# Patient Record
Sex: Female | Born: 1955 | Race: White | Hispanic: No | Smoking: Never smoker
Health system: Southern US, Community
[De-identification: ages and names within clinical notes are randomized; demographics above are authoritative.]

## PROBLEM LIST (undated history)

## (undated) DIAGNOSIS — I803 Phlebitis and thrombophlebitis of lower extremities, unspecified: Secondary | ICD-10-CM

## (undated) DIAGNOSIS — S300XXA Contusion of lower back and pelvis, initial encounter: Secondary | ICD-10-CM

## (undated) DIAGNOSIS — T148XXA Other injury of unspecified body region, initial encounter: Secondary | ICD-10-CM

## (undated) DIAGNOSIS — O2230 Deep phlebothrombosis in pregnancy, unspecified trimester: Secondary | ICD-10-CM

## (undated) DIAGNOSIS — E785 Hyperlipidemia, unspecified: Secondary | ICD-10-CM

## (undated) DIAGNOSIS — F411 Generalized anxiety disorder: Secondary | ICD-10-CM

## (undated) DIAGNOSIS — I8390 Asymptomatic varicose veins of unspecified lower extremity: Secondary | ICD-10-CM

## (undated) DIAGNOSIS — N951 Menopausal and female climacteric states: Secondary | ICD-10-CM

## (undated) HISTORY — DX: Menopausal and female climacteric states: N95.1

## (undated) HISTORY — DX: Asymptomatic varicose veins of unspecified lower extremity: I83.90

## (undated) HISTORY — DX: Deep phlebothrombosis in pregnancy, unspecified trimester: O22.30

## (undated) HISTORY — DX: Other injury of unspecified body region, initial encounter: T14.8XXA

## (undated) HISTORY — DX: Phlebitis and thrombophlebitis of lower extremities, unspecified: I80.3

## (undated) HISTORY — DX: Contusion of lower back and pelvis, initial encounter: S30.0XXA

## (undated) HISTORY — DX: Hyperlipidemia, unspecified: E78.5

## (undated) HISTORY — DX: Generalized anxiety disorder: F41.1

---

## 1988-01-14 DIAGNOSIS — I803 Phlebitis and thrombophlebitis of lower extremities, unspecified: Secondary | ICD-10-CM

## 1988-01-14 HISTORY — DX: Phlebitis and thrombophlebitis of lower extremities, unspecified: I80.3

## 1988-12-13 HISTORY — PX: CHOLECYSTECTOMY: SHX55

## 1997-10-05 ENCOUNTER — Ambulatory Visit (HOSPITAL_COMMUNITY): Admission: RE | Admit: 1997-10-05 | Discharge: 1997-10-05 | Payer: Self-pay | Admitting: Obstetrics & Gynecology

## 1997-10-05 ENCOUNTER — Encounter: Payer: Self-pay | Admitting: Obstetrics & Gynecology

## 1997-10-11 ENCOUNTER — Ambulatory Visit (HOSPITAL_COMMUNITY): Admission: RE | Admit: 1997-10-11 | Discharge: 1997-10-11 | Payer: Self-pay | Admitting: Obstetrics & Gynecology

## 1998-04-18 ENCOUNTER — Other Ambulatory Visit: Admission: RE | Admit: 1998-04-18 | Discharge: 1998-04-18 | Payer: Self-pay | Admitting: Obstetrics & Gynecology

## 1998-10-17 ENCOUNTER — Ambulatory Visit (HOSPITAL_COMMUNITY): Admission: RE | Admit: 1998-10-17 | Discharge: 1998-10-17 | Payer: Self-pay | Admitting: Obstetrics & Gynecology

## 1998-10-17 ENCOUNTER — Encounter: Payer: Self-pay | Admitting: Obstetrics & Gynecology

## 1999-06-17 ENCOUNTER — Other Ambulatory Visit: Admission: RE | Admit: 1999-06-17 | Discharge: 1999-06-17 | Payer: Self-pay | Admitting: Obstetrics & Gynecology

## 1999-10-21 ENCOUNTER — Ambulatory Visit (HOSPITAL_COMMUNITY): Admission: RE | Admit: 1999-10-21 | Discharge: 1999-10-21 | Payer: Self-pay | Admitting: Obstetrics & Gynecology

## 1999-10-21 ENCOUNTER — Encounter: Payer: Self-pay | Admitting: Obstetrics & Gynecology

## 2000-09-02 ENCOUNTER — Other Ambulatory Visit: Admission: RE | Admit: 2000-09-02 | Discharge: 2000-09-02 | Payer: Self-pay | Admitting: Obstetrics & Gynecology

## 2000-10-29 ENCOUNTER — Ambulatory Visit (HOSPITAL_COMMUNITY): Admission: RE | Admit: 2000-10-29 | Discharge: 2000-10-29 | Payer: Self-pay | Admitting: Obstetrics & Gynecology

## 2000-10-29 ENCOUNTER — Encounter: Payer: Self-pay | Admitting: Obstetrics & Gynecology

## 2001-10-21 ENCOUNTER — Other Ambulatory Visit: Admission: RE | Admit: 2001-10-21 | Discharge: 2001-10-21 | Payer: Self-pay | Admitting: Obstetrics & Gynecology

## 2001-11-09 ENCOUNTER — Encounter: Payer: Self-pay | Admitting: Obstetrics & Gynecology

## 2001-11-09 ENCOUNTER — Ambulatory Visit (HOSPITAL_COMMUNITY): Admission: RE | Admit: 2001-11-09 | Discharge: 2001-11-09 | Payer: Self-pay | Admitting: Obstetrics & Gynecology

## 2002-11-11 ENCOUNTER — Ambulatory Visit (HOSPITAL_COMMUNITY): Admission: RE | Admit: 2002-11-11 | Discharge: 2002-11-11 | Payer: Self-pay | Admitting: Obstetrics & Gynecology

## 2002-12-05 ENCOUNTER — Other Ambulatory Visit: Admission: RE | Admit: 2002-12-05 | Discharge: 2002-12-05 | Payer: Self-pay | Admitting: Obstetrics & Gynecology

## 2003-12-19 ENCOUNTER — Ambulatory Visit (HOSPITAL_COMMUNITY): Admission: RE | Admit: 2003-12-19 | Discharge: 2003-12-19 | Payer: Self-pay | Admitting: Obstetrics & Gynecology

## 2003-12-19 ENCOUNTER — Other Ambulatory Visit: Admission: RE | Admit: 2003-12-19 | Discharge: 2003-12-19 | Payer: Self-pay | Admitting: Obstetrics and Gynecology

## 2004-01-14 HISTORY — PX: ENDOMETRIAL ABLATION: SHX621

## 2004-02-20 ENCOUNTER — Ambulatory Visit (HOSPITAL_COMMUNITY): Admission: RE | Admit: 2004-02-20 | Discharge: 2004-02-20 | Payer: Self-pay | Admitting: Obstetrics & Gynecology

## 2004-02-20 ENCOUNTER — Encounter (INDEPENDENT_AMBULATORY_CARE_PROVIDER_SITE_OTHER): Payer: Self-pay | Admitting: Specialist

## 2005-01-21 ENCOUNTER — Other Ambulatory Visit: Admission: RE | Admit: 2005-01-21 | Discharge: 2005-01-21 | Payer: Self-pay | Admitting: Obstetrics & Gynecology

## 2005-01-21 ENCOUNTER — Ambulatory Visit (HOSPITAL_COMMUNITY): Admission: RE | Admit: 2005-01-21 | Discharge: 2005-01-21 | Payer: Self-pay | Admitting: Obstetrics & Gynecology

## 2006-02-18 ENCOUNTER — Ambulatory Visit (HOSPITAL_COMMUNITY): Admission: RE | Admit: 2006-02-18 | Discharge: 2006-02-18 | Payer: Self-pay | Admitting: Obstetrics & Gynecology

## 2007-03-01 ENCOUNTER — Ambulatory Visit (HOSPITAL_COMMUNITY): Admission: RE | Admit: 2007-03-01 | Discharge: 2007-03-01 | Payer: Self-pay | Admitting: Obstetrics & Gynecology

## 2008-03-01 ENCOUNTER — Ambulatory Visit (HOSPITAL_COMMUNITY): Admission: RE | Admit: 2008-03-01 | Discharge: 2008-03-01 | Payer: Self-pay | Admitting: Obstetrics & Gynecology

## 2008-03-07 ENCOUNTER — Encounter: Admission: RE | Admit: 2008-03-07 | Discharge: 2008-03-07 | Payer: Self-pay | Admitting: Obstetrics & Gynecology

## 2008-09-14 HISTORY — PX: COLONOSCOPY: SHX174

## 2009-03-02 ENCOUNTER — Encounter: Admission: RE | Admit: 2009-03-02 | Discharge: 2009-03-02 | Payer: Self-pay | Admitting: Obstetrics & Gynecology

## 2010-02-02 ENCOUNTER — Other Ambulatory Visit: Payer: Self-pay | Admitting: Obstetrics & Gynecology

## 2010-02-02 DIAGNOSIS — Z1239 Encounter for other screening for malignant neoplasm of breast: Secondary | ICD-10-CM

## 2010-02-13 LAB — HM DEXA SCAN: HM Dexa Scan: NORMAL

## 2010-03-04 ENCOUNTER — Ambulatory Visit: Payer: Self-pay

## 2010-03-05 ENCOUNTER — Ambulatory Visit
Admission: RE | Admit: 2010-03-05 | Discharge: 2010-03-05 | Disposition: A | Discharge status: Home / Self Care | Payer: BC Managed Care – PPO | Source: Ambulatory Visit | Attending: Obstetrics & Gynecology | Admitting: Obstetrics & Gynecology

## 2010-03-05 DIAGNOSIS — Z1239 Encounter for other screening for malignant neoplasm of breast: Secondary | ICD-10-CM

## 2010-03-12 ENCOUNTER — Ambulatory Visit: Payer: Self-pay | Admitting: Internal Medicine

## 2010-05-31 NOTE — Op Note (Signed)
Judith Oconnor, Judith Oconnor                  ACCOUNT NO.:  [id number]   MEDICAL RECORD NO.:  [id number]          PATIENT TYPE:  AMB   LOCATION:  SDC                           FACILITY:  [facility]   PHYSICIAN:  Freddy Finner, M.D.   DATE OF BIRTH:  1955/07/08   DATE OF PROCEDURE:  02/20/2004  DATE OF DISCHARGE:                                 OPERATIVE REPORT   PREOPERATIVE DIAGNOSIS:  Menorrhagia, no identifiable uterine or endometrial  pathology.   POSTOPERATIVE DIAGNOSIS:  Menorrhagia, no identifiable uterine or  endometrial pathology.   OPERATIVE PROCEDURE:  Hysteroscopy, dilatation and curettage, and NovaSure  endometrial ablation.   SURGEON:  Freddy Finner, M.D.   ANESTHESIA:  Intravenous sedation and paracervical block.   ESTIMATED INTRAOPERATIVE BLOOD LOSS:  Less than 10 mL.   LACTATED RINGER'S DEFICIT:  30 mL.   INTRAOPERATIVE COMPLICATIONS:  None.   PARAMETERS WITH NOVASURE:  Uterine sounding length was 7.5, cervical length  2 cm, cavity width 4.4 cm.  Power utilized:  133 watts.  Time of ablation  procedure:  84 seconds.   The patient is a 55 year old with very heavy menses and large clots  compromising her daily activities.  Sonohysterogram has identified no  obvious uterine or endometrial pathology.  She is now admitted for  hysteroscopy and NovaSure.   She was admitted on the morning of surgery, brought to the operating room,  there placed under adequate intravenous sedation, placed in the dorsal  lithotomy position using the [facility] stirrup system.  Betadine prep was  carried out in the usual fashion.  Sterile drapes were applied.  Uterus and  cervix sounded to 7.5 cm.  Cervical length was 2 cm.  Inspection with the  12.5 degree [facility] hysteroscope was accomplished using lactated Ringer's  solution as the distending medium.  This was accomplished after the cervix  was dilated to 23 Hegar dilators.  No obvious endometrial abnormality was  noted.  Gentle thorough curettage and  expression with the Randall stone  forceps was performed to sample the endometrial tissue.  The NovaSure device  was then applied in the standard fashion without difficulty.  The parameters  were noted above.  After appropriate testing of cavity, no evidence of  perforation, the procedure was accomplished.  Reinspection with the  hysteroscope revealed adequate coagulation of the entire endometrial surface  that could be visualized.  The instruments were removed.  The patient was  awakened and taken  to the recovery room in good condition.  She will be discharged in the  immediate postop period for follow-up in the office in about seven to 10  days.  She is given Darvocet as needed for postoperative pain.  She is given  Toradol 30 mg IV in recovery.      WRN/MEDQ  D:  02/20/2004  T:  02/20/2004  Job:  [id number]

## 2010-08-14 ENCOUNTER — Emergency Department (HOSPITAL_COMMUNITY)
Admission: EM | Admit: 2010-08-14 | Discharge: 2010-08-14 | Disposition: A | Discharge status: Home / Self Care | Payer: BC Managed Care – PPO | Attending: Emergency Medicine | Admitting: Emergency Medicine

## 2010-08-14 DIAGNOSIS — M79609 Pain in unspecified limb: Secondary | ICD-10-CM | POA: Insufficient documentation

## 2010-08-14 DIAGNOSIS — Z86718 Personal history of other venous thrombosis and embolism: Secondary | ICD-10-CM | POA: Insufficient documentation

## 2010-08-14 DIAGNOSIS — R609 Edema, unspecified: Secondary | ICD-10-CM | POA: Insufficient documentation

## 2010-08-14 LAB — DIFFERENTIAL
Basophils Absolute: 0 10*3/uL (ref 0.0–0.1)
Lymphocytes Relative: 29 % (ref 12–46)
Lymphs Abs: 2.1 10*3/uL (ref 0.7–4.0)
Neutro Abs: 4.3 10*3/uL (ref 1.7–7.7)
Neutrophils Relative %: 60 % (ref 43–77)

## 2010-08-14 LAB — COMPREHENSIVE METABOLIC PANEL
ALT: 30 U/L (ref 0–35)
AST: 23 U/L (ref 0–37)
Albumin: 4.3 g/dL (ref 3.5–5.2)
Alkaline Phosphatase: 75 U/L (ref 39–117)
BUN: 14 mg/dL (ref 6–23)
Calcium: 9.9 mg/dL (ref 8.4–10.5)
Chloride: 102 mEq/L (ref 96–112)
Potassium: 3.8 mEq/L (ref 3.5–5.1)
Total Protein: 7.4 g/dL (ref 6.0–8.3)

## 2010-08-14 LAB — CBC
HCT: 37.2 % (ref 36.0–46.0)
Hemoglobin: 12.4 g/dL (ref 12.0–15.0)
MCH: 29.7 pg (ref 26.0–34.0)
MCHC: 33.3 g/dL (ref 30.0–36.0)
MCV: 89.2 fL (ref 78.0–100.0)
RBC: 4.17 MIL/uL (ref 3.87–5.11)

## 2010-08-14 LAB — PROTIME-INR: Prothrombin Time: 11.9 seconds (ref 11.6–15.2)

## 2010-08-15 ENCOUNTER — Ambulatory Visit (HOSPITAL_COMMUNITY)
Admission: RE | Admit: 2010-08-15 | Discharge: 2010-08-15 | Disposition: A | Discharge status: Home / Self Care | Payer: BC Managed Care – PPO | Source: Ambulatory Visit | Attending: Emergency Medicine | Admitting: Emergency Medicine

## 2010-08-15 DIAGNOSIS — M79609 Pain in unspecified limb: Secondary | ICD-10-CM

## 2010-08-16 ENCOUNTER — Ambulatory Visit (HOSPITAL_COMMUNITY)
Admission: RE | Admit: 2010-08-16 | Discharge: 2010-08-16 | Disposition: A | Discharge status: Home / Self Care | Payer: BC Managed Care – PPO | Source: Ambulatory Visit | Attending: Internal Medicine | Admitting: Internal Medicine

## 2010-10-06 DIAGNOSIS — T148XXA Other injury of unspecified body region, initial encounter: Secondary | ICD-10-CM

## 2010-10-06 HISTORY — DX: Other injury of unspecified body region, initial encounter: T14.8XXA

## 2010-10-30 ENCOUNTER — Encounter (INDEPENDENT_AMBULATORY_CARE_PROVIDER_SITE_OTHER): Payer: Self-pay | Admitting: General Surgery

## 2010-10-30 ENCOUNTER — Ambulatory Visit (INDEPENDENT_AMBULATORY_CARE_PROVIDER_SITE_OTHER): Payer: BC Managed Care – PPO | Admitting: General Surgery

## 2010-10-30 VITALS — BP 112/78 | HR 80 | Temp 98.0°F | Resp 20 | Ht 67.0 in | Wt 161.1 lb

## 2010-10-30 DIAGNOSIS — S7002XA Contusion of left hip, initial encounter: Secondary | ICD-10-CM

## 2010-10-30 DIAGNOSIS — S7000XA Contusion of unspecified hip, initial encounter: Secondary | ICD-10-CM

## 2010-10-30 NOTE — Progress Notes (Signed)
Chief Complaint  Patient presents with  . Other    new pt fell and has a bruise/ hematoma on left buttock     HPI Judith Oconnor is a 55 y.o. female.  this patient presents today for evaluation of swelling in her left buttock region after falling from a [place].  She fell approximately 4 weeks ago onto her buttocks and since then has had persistent swelling in the region. She also had some bruising but the bruising is improving. She has minimal tenderness and denies any neurologic symptoms in her extremities. She denied any other injuries. She is not on any blood thinners other than a baby aspirin daily.Although her bruising is improving and she is minimally symptomatic she is concerned because she still has swelling in the region. HPI  Past Medical History  Diagnosis Date  . Hematoma 10/06/10    buttock     Past Surgical History  Procedure Date  . Cholecystectomy December 1990  . Endometrial ablation 2006    History reviewed. No pertinent family history.  Social History History  Substance Use Topics  . Smoking status: Never Smoker   . Smokeless tobacco: Never Used  . Alcohol Use: 0.0 oz/week    6-8 Glasses of wine per week    No Known Allergies  Current Outpatient Prescriptions  Medication Sig Dispense Refill  . escitalopram (LEXAPRO) 10 MG tablet       . Estradiol (ELESTRIN TD) Place onto the skin.          Review of Systems Review of Systems  All other systems reviewed and are negative.    Blood pressure 112/78, pulse 80, temperature 98 F (36.7 C), resp. rate 20, height 5\' 7"  (1.702 m), weight 161 lb 2 oz (73.086 kg).  Physical Exam Physical Exam  Constitutional: She is oriented to person, place, and time. She appears well-developed and well-nourished. No distress.  HENT:  Head: Normocephalic and atraumatic.  Mouth/Throat: No oropharyngeal exudate.  Eyes: Conjunctivae and EOM are normal. Pupils are equal, round, and reactive to light. Right eye exhibits no  discharge. Left eye exhibits no discharge. No scleral icterus.  Neck: Normal range of motion. Neck supple. No tracheal deviation present.  Cardiovascular: Normal rate, regular rhythm and normal heart sounds.   Pulmonary/Chest: Effort normal and breath sounds normal. No stridor. No respiratory distress. She has no wheezes.  Abdominal: Soft. Bowel sounds are normal. She exhibits no distension and no mass. There is no tenderness. There is no rebound and no guarding.  Musculoskeletal: Normal range of motion. She exhibits no edema and no tenderness.  Neurological: She is alert and oriented to person, place, and time.  Skin: Skin is warm and dry. No rash noted. She is not diaphoretic. No erythema. No pallor.       She has a firm mass in her upper left buttock region and some bruising on her lateral hip and inferior buttock region in the late stages of healing.  Bedside ultrasound was performed with high-frequency probe. This demonstrated a 2-1/2-3 cm deep fluid collection extending from her lateral hip to her midline approximately 20 cm wide it up to 10 cm from cranial caudad. This appears to be a simple fluid collection and is consistent with traumatic hematoma.  Psychiatric: She has a normal mood and affect. Her behavior is normal. Judgment and thought content normal.    Data Reviewed   Assessment    Traumatic hematoma of the left buttock region, no evidence of infection or active  bleeding.      Plan    This does appear to be a hematoma by history and physical exam and by bedside ultrasound. I think that this should resolve with time and I do not see any reason to drain this surgically. I explained that catheter drainage would likely be ineffective at a completely draining the cavity and may potentially increase the risk of infection. I explained that this should resolve slowly with time and she can continue her aspirin use since this appears to be stable in size. I recommended that she return  to our clinic her to the emergency room if any redness or fevers or increasing tenderness or pain at this may indicate infection. But again, at this time I would not recommend any surgical intervention for this wound.       Lodema Pilot DAVID 10/30/2010, 10:48 AM

## 2011-02-11 ENCOUNTER — Other Ambulatory Visit: Payer: Self-pay | Admitting: Obstetrics & Gynecology

## 2011-02-11 DIAGNOSIS — Z1231 Encounter for screening mammogram for malignant neoplasm of breast: Secondary | ICD-10-CM

## 2011-03-10 ENCOUNTER — Ambulatory Visit
Admission: RE | Admit: 2011-03-10 | Discharge: 2011-03-10 | Disposition: A | Discharge status: Home / Self Care | Payer: BC Managed Care – PPO | Source: Ambulatory Visit | Attending: Obstetrics & Gynecology | Admitting: Obstetrics & Gynecology

## 2011-03-10 DIAGNOSIS — Z1231 Encounter for screening mammogram for malignant neoplasm of breast: Secondary | ICD-10-CM

## 2012-02-14 LAB — HM PAP SMEAR: HM Pap smear: NORMAL

## 2012-03-15 ENCOUNTER — Other Ambulatory Visit: Payer: Self-pay

## 2012-03-15 DIAGNOSIS — Z1231 Encounter for screening mammogram for malignant neoplasm of breast: Secondary | ICD-10-CM

## 2012-04-07 ENCOUNTER — Ambulatory Visit
Admission: RE | Admit: 2012-04-07 | Discharge: 2012-04-07 | Disposition: A | Discharge status: Home / Self Care | Payer: BC Managed Care – PPO | Source: Ambulatory Visit

## 2012-04-07 DIAGNOSIS — Z1231 Encounter for screening mammogram for malignant neoplasm of breast: Secondary | ICD-10-CM

## 2012-11-29 ENCOUNTER — Encounter: Payer: Self-pay | Admitting: Internal Medicine

## 2012-11-30 ENCOUNTER — Encounter (INDEPENDENT_AMBULATORY_CARE_PROVIDER_SITE_OTHER): Payer: BC Managed Care – PPO | Admitting: *Deleted

## 2012-11-30 ENCOUNTER — Encounter: Payer: Self-pay | Admitting: Internal Medicine

## 2012-11-30 DIAGNOSIS — Z23 Encounter for immunization: Secondary | ICD-10-CM

## 2012-12-22 ENCOUNTER — Ambulatory Visit (INDEPENDENT_AMBULATORY_CARE_PROVIDER_SITE_OTHER): Payer: BC Managed Care – PPO | Admitting: Internal Medicine

## 2012-12-22 ENCOUNTER — Encounter: Payer: Self-pay | Admitting: Internal Medicine

## 2012-12-22 VITALS — BP 138/86 | HR 66 | Temp 97.3°F | Resp 12 | Ht 66.5 in | Wt 158.0 lb

## 2012-12-22 DIAGNOSIS — I803 Phlebitis and thrombophlebitis of lower extremities, unspecified: Secondary | ICD-10-CM

## 2012-12-22 DIAGNOSIS — I8390 Asymptomatic varicose veins of unspecified lower extremity: Secondary | ICD-10-CM

## 2012-12-22 DIAGNOSIS — N951 Menopausal and female climacteric states: Secondary | ICD-10-CM

## 2012-12-22 DIAGNOSIS — F411 Generalized anxiety disorder: Secondary | ICD-10-CM | POA: Insufficient documentation

## 2012-12-22 DIAGNOSIS — S300XXA Contusion of lower back and pelvis, initial encounter: Secondary | ICD-10-CM | POA: Insufficient documentation

## 2012-12-22 DIAGNOSIS — S300XXD Contusion of lower back and pelvis, subsequent encounter: Secondary | ICD-10-CM

## 2012-12-22 DIAGNOSIS — Z5189 Encounter for other specified aftercare: Secondary | ICD-10-CM

## 2012-12-22 NOTE — Patient Instructions (Signed)
Continue current medications. 

## 2012-12-22 NOTE — Progress Notes (Signed)
Subjective:    Patient ID: Judith Oconnor, female    DOB: [birth year], 56 y.o.   MRN: [id number]  Chief Complaint  Patient presents with  . Annual Exam    Yearly annual exam   . Leg Problem    Varicose vein concerns     HPI Varicose vein of leg ; had phlebitis of the left leg in 1990. Leg is now uncomfortable and has varicosities evident.  Contusion of buttock, subsequent encounter: hematoma and contusion of buttock in 2012 when she fell from a horse.  Anxiety state, unspecified: improved. She is off Lexapro.  Symptomatic menopausal or female climacteric states: she is on estrogens recommended by Dr. Jennette Kettle  Thrombophlebitis leg: in 1990 and now having residual problems as a result of vein damage then    Current Outpatient Prescriptions on File Prior to Visit  Medication Sig Dispense Refill  . Estradiol (ELESTRIN TD) Place onto the skin.         No current facility-administered medications on file prior to visit.   No Known Allergies  Immunization History  Administered Date(s) Administered  . Influenza,inj,Quad PF,36+ Mos 11/30/2012  . Influenza-Unspecified 11/19/2006, 10/11/2007, 11/25/2011  . Tdap 11/25/2011     Family History  Problem Relation Age of Onset  . Coronary artery disease Father   . Macular degeneration Mother    Family Status  Relation Status Death Age  . Mother Alive   . Father Alive   . Sister Alive   . Sister Alive   . Daughter Alive   . Son Alive   . Daughter Alive    History   Social History  . Marital Status: Widowed    Spouse Name: N/A    Number of Children: N/A  . Years of Education: N/A   Social History Main Topics  . Smoking status: Never Smoker   . Smokeless tobacco: Never Used  . Alcohol Use: 0.0 oz/week    6-8 Glasses of wine per week  . Drug Use: No  . Sexual Activity:    Other Topics Concern  . None   Social History Narrative   Dr.Neal-GYN   Dr.Martin-General Surgeon   Dr.Medoff-GI     PROCEDURES Sept 2010  Colonoscopy [facility]): normal Feb 2012 DEXA: normal Feb 2013 Pap Jennette Kettle): normal  Review of Systems  Constitutional: Negative.   HENT: Negative.   Eyes: Negative.   Respiratory: Negative.   Cardiovascular: Negative for chest pain, palpitations and leg swelling.  Gastrointestinal: Negative.  Negative for nausea, vomiting, abdominal pain, diarrhea and abdominal distention.  Endocrine: Negative.        Using an estrogen gel that is prescribed by Dr. Jennette Kettle. Continues with hot flashes from time to time despite use of estrogen.  Musculoskeletal: Negative.  Negative for arthralgias, back pain, myalgias, neck pain and neck stiffness.  Skin: Negative.  Negative for color change, pallor, rash and wound.  Allergic/Immunologic: Negative.   Neurological: Negative.   Hematological: Negative.   Psychiatric/Behavioral: Negative.        Objective:BP 138/86  Pulse 66  Temp(Src) 97.3 F (36.3 C) (Oral)  Resp 12  Ht 5' 6.5" (1.689 m)  Wt 158 lb (71.668 kg)  BMI 25.12 kg/m2  SpO2 99%    Physical Exam  Constitutional: She is oriented to person, place, and time. She appears well-developed and well-nourished. No distress.  HENT:  Head: Normocephalic and atraumatic.  Right Ear: External ear normal.  Left Ear: External ear normal.  Mouth/Throat: Oropharynx is clear and moist.  Eyes: Conjunctivae are normal. Pupils are equal, round, and reactive to light.  Neck: No JVD present. No tracheal deviation present. No thyromegaly present.  Cardiovascular: Normal rate, regular rhythm, normal heart sounds and intact distal pulses.  Exam reveals no gallop and no friction rub.   No murmur heard. Varicosities of the left leg. History of phlebitis of the left leg in 1990.  Pulmonary/Chest: No respiratory distress. She has no wheezes. She has no rales. She exhibits no tenderness.  Abdominal: She exhibits no distension and no mass. There is no tenderness.  Genitourinary:  Dr. Jennette Kettle  Musculoskeletal: Normal range  of motion. She exhibits no edema and no tenderness.  Lymphadenopathy:    She has no cervical adenopathy.  Neurological: She is alert and oriented to person, place, and time. She has normal reflexes. No cranial nerve deficit. Coordination normal.  Skin: No rash noted. No erythema. No pallor.  Continues to have a firm tissue change in the left buttock as a result of the prior large hematoma in this area.  Psychiatric: She has a normal mood and affect. Her behavior is normal. Judgment and thought content normal.          Assessment & Plan:  Varicose vein of leg - Plan: Ambulatory referral to Vascular Surgery, Lipid panel  Contusion of buttock, subsequent encounter: residual tissue changes in the left buttock  Anxiety state, unspecified: improved  Symptomatic menopausal or female climacteric states - Plan: CMP, TSH

## 2012-12-27 ENCOUNTER — Other Ambulatory Visit: Payer: Self-pay | Admitting: *Deleted

## 2012-12-27 DIAGNOSIS — I83893 Varicose veins of bilateral lower extremities with other complications: Secondary | ICD-10-CM

## 2013-02-25 ENCOUNTER — Encounter: Payer: BC Managed Care – PPO | Admitting: Vascular Surgery

## 2013-02-25 ENCOUNTER — Encounter (HOSPITAL_COMMUNITY): Payer: BC Managed Care – PPO

## 2013-03-03 ENCOUNTER — Other Ambulatory Visit: Payer: Self-pay

## 2013-03-03 DIAGNOSIS — Z1231 Encounter for screening mammogram for malignant neoplasm of breast: Secondary | ICD-10-CM

## 2013-03-25 ENCOUNTER — Encounter (HOSPITAL_COMMUNITY): Payer: BC Managed Care – PPO

## 2013-03-25 ENCOUNTER — Encounter: Payer: Self-pay | Admitting: Vascular Surgery

## 2013-03-25 ENCOUNTER — Encounter: Payer: BC Managed Care – PPO | Admitting: Vascular Surgery

## 2013-03-28 ENCOUNTER — Ambulatory Visit (INDEPENDENT_AMBULATORY_CARE_PROVIDER_SITE_OTHER): Payer: BC Managed Care – PPO | Admitting: Vascular Surgery

## 2013-03-28 ENCOUNTER — Encounter: Payer: Self-pay | Admitting: Vascular Surgery

## 2013-03-28 ENCOUNTER — Ambulatory Visit (HOSPITAL_COMMUNITY)
Admission: RE | Admit: 2013-03-28 | Discharge: 2013-03-28 | Disposition: A | Discharge status: Home / Self Care | Payer: BC Managed Care – PPO | Source: Ambulatory Visit | Attending: Vascular Surgery | Admitting: Vascular Surgery

## 2013-03-28 VITALS — BP 158/77 | HR 71 | Resp 16 | Ht 67.0 in | Wt 158.0 lb

## 2013-03-28 DIAGNOSIS — I83893 Varicose veins of bilateral lower extremities with other complications: Secondary | ICD-10-CM | POA: Insufficient documentation

## 2013-03-28 NOTE — Progress Notes (Signed)
Subjective:     Patient ID: Judith Oconnor, female   DOB: [birth year], 58 y.o.   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 Frederik Pear. Patient states in 1990 she developed a DVT 5 weeks after a long plane flight. She was treated with subcutaneous heparin injections because she was pregnant at the time. She's had no subsequent DVT. She has not been on chronic anticoagulation. She has mild swelling in the left leg as the day progresses. She has developed significant varicosities as well as diffuse spider and reticular veins. She has had venous aching and throbbing in the varicosities near the left ankle as the day progresses. She does not wear elastic compression stockings a regular basis or elevate her legs. She has no history stasis ulcers or bleeding.  Past Medical History  Diagnosis Date  . Hematoma 10/06/10    buttock   . Anxiety state, unspecified   . Varicose vein of leg   . Contusion of buttock   . Symptomatic menopausal or female climacteric states   . Thrombophlebitis leg 1990    History  Substance Use Topics  . Smoking status: Never Smoker   . Smokeless tobacco: Never Used  . Alcohol Use: 0.0 oz/week    6-8 Glasses of wine per week    Family History  Problem Relation Age of Onset  . Coronary artery disease Father   . Macular degeneration Mother     No Known Allergies  Current outpatient prescriptions:Estradiol (ELESTRIN TD), Place onto the skin.  , Disp: , Rfl: ;  Progesterone 100 MG CAPS, Take by mouth. Take x 10 days every 3 months, Disp: , Rfl:   BP 158/77  Pulse 71  Resp 16  Ht 5\' 7"  (1.702 m)  Wt 158 lb (71.668 kg)  BMI 24.74 kg/m2  Body mass index is 24.74 kg/(m^2).          Review of Systems denies chest pain, dyspnea on exertion, PND, orthopnea, mops as, claudication. Does have a history of swelling in the left leg and varicose veins  as noted in the history and physical. All other systems negative and a complete review of systems     Objective:   Physical Exam BP 158/77  Pulse 71  Resp 16  Ht 5\' 7"  (1.702 m)  Wt 158 lb (71.668 kg)  BMI 24.74 kg/m2  Gen.-alert and oriented x3 in no apparent distress HEENT normal for age Lungs no rhonchi or wheezing Cardiovascular regular rhythm no murmurs carotid pulses 3+ palpable no bruits audible Abdomen soft nontender no palpable masses Musculoskeletal free of  major deformities Skin clear -no rashes Neurologic normal Lower extremities 3+ femoral and dorsalis pedis pulses palpable bilaterally with no edema in the right leg Left leg with 1+ edema. Bulging varicosities over the great saphenous system lower third of the leg. Spider and reticular veins in the lower leg particularly the lower third down to the malleolar area. No active ulcers are noted. No hyperpigmentation noted.  Today I ordered a venous duplex exam the left leg which are reviewed and interpreted. There is no DVT. There are some chronic changes in left common femoral vein where the previous DVT was located but it is widely patent. The left great saphenous vein has gross reflux throughout and is a large caliber vein. 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

## 2013-04-25 ENCOUNTER — Ambulatory Visit
Admission: RE | Admit: 2013-04-25 | Discharge: 2013-04-25 | Disposition: A | Discharge status: Home / Self Care | Payer: BC Managed Care – PPO | Source: Ambulatory Visit

## 2013-04-25 DIAGNOSIS — Z1231 Encounter for screening mammogram for malignant neoplasm of breast: Secondary | ICD-10-CM

## 2013-06-27 ENCOUNTER — Encounter: Payer: Self-pay | Admitting: Vascular Surgery

## 2013-06-28 ENCOUNTER — Ambulatory Visit (INDEPENDENT_AMBULATORY_CARE_PROVIDER_SITE_OTHER): Payer: BC Managed Care – PPO | Admitting: Vascular Surgery

## 2013-06-28 ENCOUNTER — Ambulatory Visit: Payer: BC Managed Care – PPO | Admitting: Vascular Surgery

## 2013-06-28 ENCOUNTER — Encounter: Payer: Self-pay | Admitting: Vascular Surgery

## 2013-06-28 VITALS — BP 119/79 | HR 69 | Resp 16 | Ht 67.0 in | Wt 156.0 lb

## 2013-06-28 DIAGNOSIS — I83893 Varicose veins of bilateral lower extremities with other complications: Secondary | ICD-10-CM

## 2013-06-28 NOTE — Progress Notes (Signed)
Subjective:     Patient ID: Judith Oconnor, female   DOB: [birth year], 58 y.o.   MRN: [id number]  HPI this 58 year old female returns for continuing followup regarding her painful varicosities in the left lower extremity. She has tried long-leg elastic compression stockings 20-30 mm gradient as well as elevation ibuprofen continues to pain consisting of burning and itching discomfort as well as edema in the left ankle as the day progresses. Her symptoms continue to affect her daily living and are not responding to conservative measures.  Past Medical History  Diagnosis Date  . Hematoma 10/06/10    buttock   . Anxiety state, unspecified   . Varicose vein of leg   . Contusion of buttock   . Symptomatic menopausal or female climacteric states   . Thrombophlebitis leg 1990    History  Substance Use Topics  . Smoking status: Never Smoker   . Smokeless tobacco: Never Used  . Alcohol Use: 0.0 oz/week    6-8 Glasses of wine per week    Family History  Problem Relation Age of Onset  . Coronary artery disease Father   . Macular degeneration Mother     No Known Allergies  Current outpatient prescriptions:Estradiol (ELESTRIN TD), Place onto the skin.  , Disp: , Rfl: ;  Progesterone 100 MG CAPS, Take by mouth. Take x 10 days every 3 months, Disp: , Rfl:   There were no vitals taken for this visit.  There is no weight on file to calculate BMI.          Review of Systems denies chest pain, dyspnea on exertion, PND, orthopnea, hemoptysis.     Objective:   Physical Exam There were no vitals taken for this visit. General well-developed well-nourished female in no apparent stress were noted x3 Left lower extremity with bulging varicosities lower third of leg around medial malleolus with reticular veins. One plus distal edema. 3+ dorsalis pedis pulse palpable. Also prominent veins in left patellar region.       Assessment:     Gross reflux left great saphenous system supplying  bulging painful varicosities particularly in distal leg around the ankle area. Causing pain which is not responding to conservative measures including long-leg elastic compression stockings 20-30 mm gradient, elevation, and ibuprofen History DVT left common femoral vein 25 years ago-patent at this time via ultrasound     Plan:     Patient needs laser ablation left great saphenous vein with 10-20 stab phlebectomy painful varicosities We'll proceed with precertification to perform this in the near future to relieve her symptoms

## 2013-07-27 ENCOUNTER — Other Ambulatory Visit: Payer: Self-pay | Admitting: *Deleted

## 2013-07-27 DIAGNOSIS — I83893 Varicose veins of bilateral lower extremities with other complications: Secondary | ICD-10-CM

## 2013-10-10 ENCOUNTER — Other Ambulatory Visit: Payer: BC Managed Care – PPO | Admitting: Vascular Surgery

## 2013-10-11 ENCOUNTER — Encounter: Payer: Self-pay | Admitting: Vascular Surgery

## 2013-10-12 ENCOUNTER — Encounter: Payer: Self-pay | Admitting: Vascular Surgery

## 2013-10-12 ENCOUNTER — Ambulatory Visit (INDEPENDENT_AMBULATORY_CARE_PROVIDER_SITE_OTHER): Payer: BC Managed Care – PPO | Admitting: Vascular Surgery

## 2013-10-12 VITALS — BP 135/77 | HR 69 | Resp 16 | Ht 67.0 in | Wt 160.0 lb

## 2013-10-12 DIAGNOSIS — I83893 Varicose veins of bilateral lower extremities with other complications: Secondary | ICD-10-CM

## 2013-10-12 HISTORY — PX: VARICOSE VEIN SURGERY: SHX832

## 2013-10-12 NOTE — Progress Notes (Signed)
Subjective:     Patien t ID: Judith Oconnor, female   DOB: September 04, 1955, 58 y.o.   MRN: [id number]  HPI this 58 year old female had laser blush in the left great saphenous vein from the proximal calf to near the saphenofemoral junction +10-20 stab phlebectomy of painful varicosities performed under local tumescent anesthesia. A total of 2275 J of energy was utilized. She tolerated the procedure well.  Review of Systems     Objective:   Physical Exam BP 135/77  Pulse 69  Resp 16  Ht 5\' 7"  (1.702 m)  Wt 160 lb (72.576 kg)  BMI 25.05 kg/m2       Assessment:     Well-tolerated laser ablation left great saphenous vein +10-20 stab phlebectomy of painful varicosities performed under local tumescent anesthesia    Plan:     Return in one week for venous duplex exam to confirm closure left great saphenous vein and this will complete treatment regimen

## 2013-10-12 NOTE — Progress Notes (Signed)
   Laser Ablation Procedure      Date: 10/12/2013    Judith Oconnor DOB:09/28/1955  Consent signed: Yes  Surgeon:J.D. Hart RochesterLawson  Procedure: Laser Ablation: left Greater Saphenous Vein  BP 135/77  Pulse 69  Resp 16  Ht 5\' 7"  (1.702 m)  Wt 160 lb (72.576 kg)  BMI 25.05 kg/m2  Start time: 9:15   End time: 10:20  Tumescent Anesthesia: 400 cc 0.9% NaCl with 50 cc Lidocaine HCL with 1% Epi and 15 cc 8.4% NaHCO3  Local Anesthesia: 7 cc Lidocaine HCL and NaHCO3 (ratio 2:1)  Pulsed mode: 15 watts, 500 ms delay, 1.0 duration  Total energy: 2275, Total pulses: 152, Total time: 2:31     Stab Phlebectomy: >20 Sites: Calf  Patient tolerated procedure well: Yes  Notes:   Description of Procedure:  After marking the course of the secondary varicosities, the patient was placed on the operating table in the supine position, and the left leg was prepped and draped in sterile fashion.   Local anesthetic was administered and under ultrasound guidance the saphenous vein was accessed with a micro needle and guide wire; then the mirco puncture sheath was place.  A guide wire was inserted saphenofemoral junction , followed by a 5 french sheath.  The position of the sheath and then the laser fiber below the junction was confirmed using the ultrasound.  Tumescent anesthesia was administered along the course of the saphenous vein using ultrasound guidance. The patient was placed in Trendelenburg position and protective laser glasses were placed on patient and staff, and the laser was fired at 15 watt pulsed mode advancing 1-2 mm per sec for a total of 2275 joules.   For stab phlebectomies, local anesthetic was administered at the previously marked varicosities, and tumescent anesthesia was administered around the vessels.  Greater than 20 stab wounds were made using the tip of an 11 blade. And using the vein hook, the phlebectomies were performed using a hemostat to avulse the varicosities.  Adequate  hemostasis was achieved.     Steri strips were applied to the stab wounds and ABD pads and thigh high compression stockings were applied.  Ace wrap bandages were applied over the phlebectomy sites and at the top of the saphenofemoral junction. Blood loss was less than 15 cc.  The patient ambulated out of the operating room having tolerated the procedure well.

## 2013-10-13 ENCOUNTER — Encounter: Payer: Self-pay | Admitting: Vascular Surgery

## 2013-10-17 ENCOUNTER — Encounter: Payer: Self-pay | Admitting: Vascular Surgery

## 2013-10-18 ENCOUNTER — Ambulatory Visit (HOSPITAL_COMMUNITY)
Admission: RE | Admit: 2013-10-18 | Discharge: 2013-10-18 | Disposition: A | Discharge status: Home / Self Care | Payer: BC Managed Care – PPO | Source: Ambulatory Visit | Attending: Vascular Surgery | Admitting: Vascular Surgery

## 2013-10-18 ENCOUNTER — Encounter: Payer: Self-pay | Admitting: Vascular Surgery

## 2013-10-18 ENCOUNTER — Ambulatory Visit (INDEPENDENT_AMBULATORY_CARE_PROVIDER_SITE_OTHER): Payer: Self-pay | Admitting: Vascular Surgery

## 2013-10-18 VITALS — BP 136/74 | HR 73 | Resp 16 | Ht 67.0 in | Wt 160.0 lb

## 2013-10-18 DIAGNOSIS — I83899 Varicose veins of unspecified lower extremities with other complications: Secondary | ICD-10-CM | POA: Insufficient documentation

## 2013-10-18 DIAGNOSIS — I8393 Asymptomatic varicose veins of bilateral lower extremities: Secondary | ICD-10-CM

## 2013-10-18 DIAGNOSIS — I83892 Varicose veins of left lower extremities with other complications: Secondary | ICD-10-CM

## 2013-10-18 NOTE — Progress Notes (Signed)
Subjective:     Patient ID: Judith Oconnor, female   DOB: 1955-07-28, 58 y.o.   MRN: [id number]  HPI this 58 year old female returns 1 week post laser ablation left great saphenous vein with multiple stab phlebectomy of painful varicosities. She has had some mild-to-moderate discomfort in the mid to proximal thigh along the ablation site. There's been no pain associated with the stab phlebectomy sites. She's had no distal edema. She denies any chest pain dyspnea on exertion PND orthopnea or hemoptysis.   Review of Systems     Objective:   Physical Exam BP 136/74  Pulse 73  Resp 16  Ht 5\' 7"  (1.702 m)  Wt 160 lb (72.576 kg)  BMI 25.05 kg/m2  General well-developed well-nourished female no apparent distress alert and oriented x3 Left leg with mild discomfort along course of great saphenous vein from saphenofemoral junction to distal thigh. No distal edema. 3 posterior cells pedis pulse palpable. Stab phlebectomy sites healing nicely.  Today I ordered a venous duplex exam of the left leg which I reviewed and interpreted. There is no new DVT. The left great saphenous vein is totally closed up to the saphenofemoral junction with no extension into the deep system. 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     Assessment:     Successful laser ablation left great saphenous vein with multiple stab phlebectomy of painful varicosities    Plan:     Return to see us on when necessary basis

## 2013-12-23 ENCOUNTER — Other Ambulatory Visit: Payer: Self-pay

## 2013-12-23 ENCOUNTER — Other Ambulatory Visit: Payer: BC Managed Care – PPO

## 2013-12-23 DIAGNOSIS — Z0000 Encounter for general adult medical examination without abnormal findings: Secondary | ICD-10-CM

## 2013-12-24 LAB — TSH: TSH: 1.43 u[IU]/mL (ref 0.450–4.500)

## 2013-12-24 LAB — COMPREHENSIVE METABOLIC PANEL
A/G RATIO: 1.8 (ref 1.1–2.5)
ALT: 14 IU/L (ref 0–32)
AST: 14 IU/L (ref 0–40)
Albumin: 4.4 g/dL (ref 3.5–5.5)
Alkaline Phosphatase: 58 IU/L (ref 39–117)
BILIRUBIN TOTAL: 0.4 mg/dL (ref 0.0–1.2)
BUN/Creatinine Ratio: 19 (ref 9–23)
BUN: 13 mg/dL (ref 6–24)
CO2: 23 mmol/L (ref 18–29)
Calcium: 9.3 mg/dL (ref 8.7–10.2)
Chloride: 105 mmol/L (ref 97–108)
Creatinine, Ser: 0.69 mg/dL (ref 0.57–1.00)
GFR, EST AFRICAN AMERICAN: 112 mL/min/{1.73_m2} (ref >59)
GFR, EST NON AFRICAN AMERICAN: 97 mL/min/{1.73_m2} (ref >59)
Globulin, Total: 2.4 g/dL (ref 1.5–4.5)
Glucose: 102 mg/dL — ABNORMAL HIGH (ref 65–99)
Potassium: 4.5 mmol/L (ref 3.5–5.2)
Sodium: 142 mmol/L (ref 134–144)
TOTAL PROTEIN: 6.8 g/dL (ref 6.0–8.5)

## 2013-12-24 LAB — LIPID PANEL
CHOL/HDL RATIO: 4 ratio (ref 0.0–4.4)
Cholesterol, Total: 218 mg/dL — ABNORMAL HIGH (ref 100–199)
HDL: 55 mg/dL (ref >39)
LDL CALC: 128 mg/dL — AB (ref 0–99)
Triglycerides: 174 mg/dL — ABNORMAL HIGH (ref 0–149)
VLDL CHOLESTEROL CAL: 35 mg/dL (ref 5–40)

## 2013-12-27 ENCOUNTER — Encounter: Payer: Self-pay | Admitting: Internal Medicine

## 2013-12-27 ENCOUNTER — Ambulatory Visit (INDEPENDENT_AMBULATORY_CARE_PROVIDER_SITE_OTHER): Payer: BC Managed Care – PPO | Admitting: Internal Medicine

## 2013-12-27 VITALS — BP 124/80 | HR 76 | Temp 98.0°F | Resp 18 | Ht 67.0 in | Wt 157.8 lb

## 2013-12-27 DIAGNOSIS — I83892 Varicose veins of left lower extremities with other complications: Secondary | ICD-10-CM

## 2013-12-27 DIAGNOSIS — F411 Generalized anxiety disorder: Secondary | ICD-10-CM

## 2013-12-27 DIAGNOSIS — S300XXD Contusion of lower back and pelvis, subsequent encounter: Secondary | ICD-10-CM

## 2013-12-27 DIAGNOSIS — N951 Menopausal and female climacteric states: Secondary | ICD-10-CM

## 2013-12-27 NOTE — Progress Notes (Deleted)
Patient ID: Clemens CatholicMary B Mitton, female   DOB: 02/09/1955, 58 y.o.   MRN: [id number]    Facility  [facility]    Place of Service:   OFFICE   No Known Allergies  Chief Complaint  Patient presents with  . Annual Exam    HPI:    Medications: Patient's Medications  New Prescriptions   No medications on file  Previous Medications   ESTRADIOL (ELESTRIN TD)    Place onto the skin.     PROGESTERONE 100 MG CAPS    Take by mouth. Take x 10 days every 3 months  Modified Medications   No medications on file  Discontinued Medications   No medications on file     Review of Systems  Constitutional: Negative.   HENT: Negative.   Eyes: Negative.   Respiratory: Negative.   Cardiovascular: Negative for chest pain, palpitations and leg swelling.  Gastrointestinal: Negative.  Negative for nausea, vomiting, abdominal pain, diarrhea and abdominal distention.  Endocrine: Negative.        Using an estrogen gel that is prescribed by Dr. Jennette KettleNeal. Continues with hot flashes from time to time despite use of estrogen.  Musculoskeletal: Negative.  Negative for myalgias, back pain, arthralgias, neck pain and neck stiffness.  Skin: Negative.  Negative for color change, pallor, rash and wound.  Allergic/Immunologic: Negative.   Neurological: Negative.   Hematological: Negative.   Psychiatric/Behavioral: Negative.     Filed Vitals:   12/27/13 1441  BP: 124/80  Pulse: 76  Temp: 98 F (36.7 C)  TempSrc: Oral  Resp: 18  Height: 5\' 7"  (1.702 m)  Weight: 157 lb 12.8 oz (71.578 kg)  SpO2: 99%   Body mass index is 24.71 kg/(m^2).  Physical Exam  Constitutional: She is oriented to person, place, and time. She appears well-developed and well-nourished. No distress.  HENT:  Head: Normocephalic and atraumatic.  Right Ear: External ear normal.  Left Ear: External ear normal.  Mouth/Throat: Oropharynx is clear and moist.  Eyes: Conjunctivae are normal. Pupils are equal, round, and reactive to light.  Neck: No  JVD present. No tracheal deviation present. No thyromegaly present.  Cardiovascular: Normal rate, regular rhythm, normal heart sounds and intact distal pulses.  Exam reveals no gallop and no friction rub.   No murmur heard. Varicosities of the left leg. History of phlebitis of the left leg in 1990.  Pulmonary/Chest: No respiratory distress. She has no wheezes. She has no rales. She exhibits no tenderness.  Abdominal: She exhibits no distension and no mass. There is no tenderness.  Genitourinary:  Dr. Jennette KettleNeal  Musculoskeletal: Normal range of motion. She exhibits no edema or tenderness.  Lymphadenopathy:    She has no cervical adenopathy.  Neurological: She is alert and oriented to person, place, and time. She has normal reflexes. No cranial nerve deficit. Coordination normal.  Skin: No rash noted. No erythema. No pallor.  Continues to have a firm tissue change in the left buttock as a result of the prior large hematoma in this area.  Psychiatric: She has a normal mood and affect. Her behavior is normal. Judgment and thought content normal.     Labs reviewed: Appointment on 12/23/2013  Component Date Value Ref Range Status  . Glucose 12/23/2013 102* 65 - 99 mg/dL Final  . BUN 52/84/132412/11/2013 13  6 - 24 mg/dL Final  . Creatinine, Ser 12/23/2013 0.69  0.57 - 1.00 mg/dL Final  . GFR calc non Af Amer 12/23/2013 97  >59 mL/min/1.73 Final  .  GFR calc Af Amer 12/23/2013 112  >59 mL/min/1.73 Final  . BUN/Creatinine Ratio 12/23/2013 19  9 - 23 Final  . Sodium 12/23/2013 142  134 - 144 mmol/L Final  . Potassium 12/23/2013 4.5  3.5 - 5.2 mmol/L Final  . Chloride 12/23/2013 105  97 - 108 mmol/L Final  . CO2 12/23/2013 23  18 - 29 mmol/L Final  . Calcium 12/23/2013 9.3  8.7 - 10.2 mg/dL Final  . Total Protein 12/23/2013 6.8  6.0 - 8.5 g/dL Final  . Albumin 16/10/960412/11/2013 4.4  3.5 - 5.5 g/dL Final  . Globulin, Total 12/23/2013 2.4  1.5 - 4.5 g/dL Final  . Albumin/Globulin Ratio 12/23/2013 1.8  1.1 - 2.5  Final  . Total Bilirubin 12/23/2013 0.4  0.0 - 1.2 mg/dL Final  . Alkaline Phosphatase 12/23/2013 58  39 - 117 IU/L Final  . AST 12/23/2013 14  0 - 40 IU/L Final  . ALT 12/23/2013 14  0 - 32 IU/L Final  . Cholesterol, Total 12/23/2013 218* 100 - 199 mg/dL Final  . Triglycerides 12/23/2013 174* 0 - 149 mg/dL Final  . HDL 54/09/811912/11/2013 55  >39 mg/dL Final   Comment: According to ATP-III Guidelines, HDL-C >59 mg/dL is considered a negative risk factor for CHD.   Marland Kitchen. VLDL Cholesterol Cal 12/23/2013 35  5 - 40 mg/dL Final  . LDL Calculated 12/23/2013 147128* 0 - 99 mg/dL Final  . Chol/HDL Ratio 12/23/2013 4.0  0.0 - 4.4 ratio units Final   Comment:                                   T. Chol/HDL Ratio                                             Men  Women                               1/2 Avg.Risk  3.4    3.3                                   Avg.Risk  5.0    4.4                                2X Avg.Risk  9.6    7.1                                3X Avg.Risk 23.4   11.0   . TSH 12/23/2013 1.430  0.450 - 4.500 uIU/mL Final     Assessment/Plan

## 2013-12-28 ENCOUNTER — Encounter: Payer: Self-pay | Admitting: Internal Medicine

## 2013-12-28 NOTE — Progress Notes (Signed)
Patient ID: Judith Oconnor, female   DOB: [birth year], 58 y.o.   MRN: [id number]    HISTORY AND PHYSICAL  Location:    [facility]   Place of Service:   OFFICE  Extended Emergency Contact Information Primary Emergency Contact: Hewitt BladeElliott,Ken  [place] Home Phone: [phone] Relation: Significant other Secondary Emergency Contact: Joline Saltraven,Ben          [place] Home Phone: [phone] Relation: Father  Advanced Directive information    Chief Complaint  Patient presents with  . Annual Exam    HPI:  Doing well at this time.  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 She is satisfied with results. Work was done by Dr. Jerilee FieldJ.D.[facility] surgeons.  Past Medical History  Diagnosis Date  . Hematoma 10/06/10    buttock   . Anxiety state, unspecified   . Varicose vein of leg   . Contusion of buttock   . Symptomatic menopausal or female climacteric states   . Thrombophlebitis leg 1990  . Varicose veins     Past Surgical History  Procedure Laterality Date  . Cholecystectomy  December 1990    Dr.Matt Daphine DeutscherMartin  . Endometrial ablation  2006    Dr.Ron Neal   . Colonoscopy  09/14/2008    Diverticulosis, Dr.Medoff   . Varicose vein surgery Left 10/12/2013    [facility]    Patient Care Team: Kimber RelicArthur G Green, MD as PCP - General (Internal Medicine) Pryor OchoaJames D Lawson, MD as Consulting Physician (Vascular Surgery) Lacretia NicksW Varney Baasonald Neal, MD as Consulting Physician (Obstetrics and Gynecology)  History   Social History  . Marital Status: Widowed    Spouse Name: N/A    Number of Children: N/A  . Years of Education: N/A   Occupational History  . Not on file.   Social History Main Topics  . Smoking status: Never Smoker   . Smokeless tobacco: Never Used  . Alcohol Use: 0.0 oz/week    6-8 Glasses of wine per week  . Drug Use: No  . Sexual Activity:  Not on file   Other Topics Concern  . Not on file   Social History Narrative  . No narrative on file     reports that she has never smoked. She has never used smokeless tobacco. She reports that she drinks alcohol. She reports that she does not use illicit drugs.  Family History  Problem Relation Age of Onset  . Coronary artery disease Father   . Macular degeneration Mother    Family Status  Relation Status Death Age  . Mother Alive   . Father Alive   . Sister Alive   . Sister Alive   . Daughter Alive   . Son Alive   . Daughter Alive     Immunization History  Administered Date(s) Administered  . Influenza,inj,Quad PF,36+ Mos 11/30/2012  . Influenza-Unspecified 11/19/2006, 10/11/2007, 11/25/2011, 10/17/2013  . Tdap 11/25/2011    No Known Allergies  Medications: Patient's Medications  New Prescriptions   No medications on file  Previous Medications   ESTRADIOL (ELESTRIN TD)    Place onto the skin.     PROGESTERONE 100 MG CAPS    Take by mouth. Take x 10 days every 3 months  Modified Medications   No medications on file  Discontinued Medications   No medications on file  Review of Systems  Constitutional: Negative.   HENT: Negative.   Eyes: Negative.   Respiratory: Negative.   Cardiovascular: Negative for chest pain, palpitations and leg swelling.  Gastrointestinal: Negative.  Negative for nausea, vomiting, abdominal pain, diarrhea and abdominal distention.  Endocrine: Negative.        Using an estrogen gel that is prescribed by Dr. Jennette KettleNeal. Continues with hot flashes from time to time despite use of estrogen.  Musculoskeletal: Negative.  Negative for myalgias, back pain, arthralgias, neck pain and neck stiffness.  Skin: Negative.  Negative for color change, pallor, rash and wound.  Allergic/Immunologic: Negative.   Neurological: Negative.   Hematological: Negative.   Psychiatric/Behavioral: Negative.     Filed Vitals:   12/27/13 1441  BP: 124/80    Pulse: 76  Temp: 98 F (36.7 C)  TempSrc: Oral  Resp: 18  Height: 5\' 7"  (1.702 m)  Weight: 157 lb 12.8 oz (71.578 kg)  SpO2: 99%   Body mass index is 24.71 kg/(m^2).  Physical Exam  Constitutional: She is oriented to person, place, and time. She appears well-developed and well-nourished. No distress.  HENT:  Head: Normocephalic and atraumatic.  Right Ear: External ear normal.  Left Ear: External ear normal.  Mouth/Throat: Oropharynx is clear and moist.  Eyes: Conjunctivae are normal. Pupils are equal, round, and reactive to light.  Neck: No JVD present. No tracheal deviation present. No thyromegaly present.  Cardiovascular: Normal rate, regular rhythm, normal heart sounds and intact distal pulses.  Exam reveals no gallop and no friction rub.   No murmur heard. Varicosities of the left leg. History of phlebitis of the left leg in 1990.  Pulmonary/Chest: No respiratory distress. She has no wheezes. She has no rales. She exhibits no tenderness.  Abdominal: She exhibits no distension and no mass. There is no tenderness.  Genitourinary:  Dr. Jennette KettleNeal  Musculoskeletal: Normal range of motion. She exhibits no edema or tenderness.  Lymphadenopathy:    She has no cervical adenopathy.  Neurological: She is alert and oriented to person, place, and time. She has normal reflexes. No cranial nerve deficit. Coordination normal.  Skin: No rash noted. No erythema. No pallor.  Continues to have a firm tissue change in the left buttock as a result of the prior large hematoma in this area.  Psychiatric: She has a normal mood and affect. Her behavior is normal. Judgment and thought content normal.     Labs reviewed: Appointment on 12/23/2013  Component Date Value Ref Range Status  . Glucose 12/23/2013 102* 65 - 99 mg/dL Final  . BUN 29/56/213012/11/2013 13  6 - 24 mg/dL Final  . Creatinine, Ser 12/23/2013 0.69  0.57 - 1.00 mg/dL Final  . GFR calc non Af Amer 12/23/2013 97  >59 mL/min/1.73 Final  . GFR  calc Af Amer 12/23/2013 112  >59 mL/min/1.73 Final  . BUN/Creatinine Ratio 12/23/2013 19  9 - 23 Final  . Sodium 12/23/2013 142  134 - 144 mmol/L Final  . Potassium 12/23/2013 4.5  3.5 - 5.2 mmol/L Final  . Chloride 12/23/2013 105  97 - 108 mmol/L Final  . CO2 12/23/2013 23  18 - 29 mmol/L Final  . Calcium 12/23/2013 9.3  8.7 - 10.2 mg/dL Final  . Total Protein 12/23/2013 6.8  6.0 - 8.5 g/dL Final  . Albumin 86/57/846912/11/2013 4.4  3.5 - 5.5 g/dL Final  . Globulin, Total 12/23/2013 2.4  1.5 - 4.5 g/dL Final  . Albumin/Globulin Ratio 12/23/2013 1.8  1.1 - 2.5 Final  .  Total Bilirubin 12/23/2013 0.4  0.0 - 1.2 mg/dL Final  . Alkaline Phosphatase 12/23/2013 58  39 - 117 IU/L Final  . AST 12/23/2013 14  0 - 40 IU/L Final  . ALT 12/23/2013 14  0 - 32 IU/L Final  . Cholesterol, Total 12/23/2013 218* 100 - 199 mg/dL Final  . Triglycerides 12/23/2013 174* 0 - 149 mg/dL Final  . HDL 16/10/9602 55  >39 mg/dL Final   Comment: According to ATP-III Guidelines, HDL-C >59 mg/dL is considered a negative risk factor for CHD.   Marland Kitchen VLDL Cholesterol Cal 12/23/2013 35  5 - 40 mg/dL Final  . LDL Calculated 12/23/2013 540* 0 - 99 mg/dL Final  . Chol/HDL Ratio 12/23/2013 4.0  0.0 - 4.4 ratio units Final   Comment:                                   T. Chol/HDL Ratio                                             Men  Women                               1/2 Avg.Risk  3.4    3.3                                   Avg.Risk  5.0    4.4                                2X Avg.Risk  9.6    7.1                                3X Avg.Risk 23.4   11.0   . TSH 12/23/2013 1.430  0.450 - 4.500 uIU/mL Final    Assessment/Plan  1. Varicose veins of leg with complications, left Has had surgical repair with laser of the left greater saphenous and some attention to spider veins of the lower leg.  2. Anxiety state Controlled  3. Contusion of buttock, subsequent encounter Residual thickening of tissue  4. Symptomatic menopausal or  female climacteric states Continue follow-up with gynecologist.

## 2014-02-23 ENCOUNTER — Other Ambulatory Visit: Payer: Self-pay

## 2014-02-23 DIAGNOSIS — Z1231 Encounter for screening mammogram for malignant neoplasm of breast: Secondary | ICD-10-CM

## 2014-05-02 ENCOUNTER — Ambulatory Visit
Admission: RE | Admit: 2014-05-02 | Discharge: 2014-05-02 | Disposition: A | Discharge status: Home / Self Care | Payer: BLUE CROSS/BLUE SHIELD | Source: Ambulatory Visit

## 2014-05-02 ENCOUNTER — Other Ambulatory Visit: Payer: Self-pay | Admitting: Obstetrics & Gynecology

## 2014-05-02 DIAGNOSIS — Z1231 Encounter for screening mammogram for malignant neoplasm of breast: Secondary | ICD-10-CM

## 2014-05-03 LAB — CYTOLOGY - PAP

## 2015-01-03 ENCOUNTER — Ambulatory Visit (INDEPENDENT_AMBULATORY_CARE_PROVIDER_SITE_OTHER): Payer: BLUE CROSS/BLUE SHIELD | Admitting: Internal Medicine

## 2015-01-03 ENCOUNTER — Encounter: Payer: Self-pay | Admitting: Internal Medicine

## 2015-01-03 VITALS — BP 120/72 | HR 74 | Temp 97.3°F | Resp 18 | Ht 67.0 in | Wt 163.6 lb

## 2015-01-03 DIAGNOSIS — Z23 Encounter for immunization: Secondary | ICD-10-CM | POA: Diagnosis not present

## 2015-01-03 DIAGNOSIS — E785 Hyperlipidemia, unspecified: Secondary | ICD-10-CM | POA: Diagnosis not present

## 2015-01-03 DIAGNOSIS — Z0000 Encounter for general adult medical examination without abnormal findings: Secondary | ICD-10-CM | POA: Diagnosis not present

## 2015-01-03 DIAGNOSIS — R002 Palpitations: Secondary | ICD-10-CM | POA: Diagnosis not present

## 2015-01-03 NOTE — Progress Notes (Signed)
Patient ID: Judith Oconnor, female   DOB: May 01, 1955, 59 y.o.   MRN: [id number]    HISTORY AND PHYSICAL  Location:    [facility]   Place of Service:   OFFICE  Extended Emergency Contact Information Primary Emergency Contact: Massie [place] Phone: [phone] Relation: Significant other Secondary Emergency Contact: Montel Culver, [place] Phone: [phone] Relation: Father  Advanced Directive information Does patient have an advance directive?: Yes  Chief Complaint  Patient presents with  . Annual Exam    Annual Exam    HPI:  Palpitations - rare. Not accompanied by chest tightness or dyspnea. No episodes of near syncope or grey-out.  Dyslipidemia - history of mild cholesterol irregularity of elevated LDL  Need for prophylactic vaccination and inoculation against influenza - Plan: Flu Vaccine QUAD 36+ mos PF IM (Fluarix & Fluzone Quad PF)    Past Medical History  Diagnosis Date  . Hematoma 10/06/10    buttock   . Anxiety state, unspecified   . Varicose vein of leg   . Contusion of buttock   . Symptomatic menopausal or female climacteric states   . Thrombophlebitis leg 1990  . Varicose veins     Past Surgical History  Procedure Laterality Date  . Cholecystectomy  December 1990    Dr.Matt Hassell Done  . Endometrial ablation  2006    Dr.Ron Neal   . Colonoscopy  09/14/2008    Diverticulosis, Dr.Medoff   . Varicose vein surgery Left 10/12/2013    [facility]    Patient Care Team: Estill Dooms, MD as PCP - General (Internal Medicine) Mal Misty, MD as Consulting Physician (Vascular Surgery) W Evette Cristal, MD as Consulting Physician (Obstetrics and Gynecology) Richmond Campbell, MD as Consulting Physician (Gastroenterology) Johnathan Hausen, MD as Consulting Physician (General Surgery)  Social History   Social History  . Marital Status: Widowed    Spouse Name: N/A  . Number of  Children: N/A  . Years of Education: N/A   Occupational History  . Not on file.   Social History Main Topics  . Smoking status: Never Smoker   . Smokeless tobacco: Never Used  . Alcohol Use: 0.0 oz/week    6-8 Glasses of wine per week  . Drug Use: No  . Sexual Activity: Not on file   Other Topics Concern  . Not on file   Social History Narrative    reports that she has never smoked. She has never used smokeless tobacco. She reports that she drinks alcohol. She reports that she does not use illicit drugs.  Family History  Problem Relation Age of Onset  . Coronary artery disease Father   . Macular degeneration Mother    Family Status  Relation Status Death Age  . Mother Alive   . Father Alive   . Sister Alive   . Sister Alive   . Daughter Alive   . Son Alive   . Daughter Alive     Immunization History  Administered Date(s) Administered  . Influenza,inj,Quad PF,36+ Mos 11/30/2012  . Influenza-Unspecified 11/19/2006, 10/11/2007, 11/25/2011, 10/17/2013  . Tdap 11/25/2011    No Known Allergies  Medications: Patient's Medications  New Prescriptions   No medications on file  Previous Medications   ELESTRIN 0.52 MG/0.87 GM (0.06%) GEL    USE 2 PUMPS DAILY AS DIRECTED.   PROGESTERONE (PROMETRIUM) 200 MG CAPSULE    Take  1 capsules by mouth for 12 nights  every 3 months  Modified Medications   No medications on file  Discontinued Medications   ESTRADIOL (ELESTRIN TD)    Place onto the skin.     PROGESTERONE 100 MG CAPS    Take by mouth. Take x 10 days every 3 months    Review of Systems  Constitutional: Negative.   HENT: Negative.   Eyes: Negative.   Respiratory: Negative.   Cardiovascular: Negative for chest pain, palpitations and leg swelling.  Gastrointestinal: Negative.  Negative for nausea, vomiting, abdominal pain, diarrhea and abdominal distention.  Endocrine: Negative.        Using an estrogen gel that is prescribed by Dr. Nori Riis. Continues with hot  flashes from time to time despite use of estrogen.  Musculoskeletal: Negative.  Negative for myalgias, back pain, arthralgias, neck pain and neck stiffness.  Skin: Negative.  Negative for color change, pallor, rash and wound.  Allergic/Immunologic: Negative.   Neurological: Negative.   Hematological: Negative.   Psychiatric/Behavioral: Negative.     Filed Vitals:   01/03/15 1250  BP: 120/72  Pulse: 74  Temp: 97.3 F (36.3 C)  TempSrc: Oral  Resp: 18  Height: 5' 7"  (1.702 m)  Weight: 163 lb 9.6 oz (74.208 kg)  SpO2: 98%   Body mass index is 25.62 kg/(m^2).  Physical Exam  Constitutional: She is oriented to person, place, and time. She appears well-developed and well-nourished. No distress.  HENT:  Head: Normocephalic and atraumatic.  Right Ear: External ear normal.  Left Ear: External ear normal.  Mouth/Throat: Oropharynx is clear and moist.  Eyes: Conjunctivae are normal. Pupils are equal, round, and reactive to light.  Neck: No JVD present. No tracheal deviation present. No thyromegaly present.  Cardiovascular: Normal rate, regular rhythm, normal heart sounds and intact distal pulses.  Exam reveals no gallop and no friction rub.   No murmur heard. Varicosities of the left leg. History of phlebitis of the left leg in 1990.  Pulmonary/Chest: No respiratory distress. She has no wheezes. She has no rales. She exhibits no tenderness.  Abdominal: She exhibits no distension and no mass. There is no tenderness.  Genitourinary:  Dr. Nori Riis  Musculoskeletal: Normal range of motion. She exhibits no edema or tenderness.  Lymphadenopathy:    She has no cervical adenopathy.  Neurological: She is alert and oriented to person, place, and time. She has normal reflexes. No cranial nerve deficit. Coordination normal.  Skin: No rash noted. No erythema. No pallor.  Continues to have a firm tissue change in the left buttock as a result of the prior large hematoma in this area.  Psychiatric: She  has a normal mood and affect. Her behavior is normal. Judgment and thought content normal.    Labs reviewed: Lab Summary Latest Ref Rng 12/23/2013 08/14/2010  Hemoglobin 12.0 - 15.0 g/dL (None) 12.4  Hematocrit 36.0 - 46.0 % (None) 37.2  White count 4.0 - 10.5 K/uL (None) 7.2  Platelet count 150 - 400 K/uL (None) 223  Sodium 134 - 144 mmol/L 142 138  Potassium 3.5 - 5.2 mmol/L 4.5 3.8  Calcium 8.7 - 10.2 mg/dL 9.3 9.9  Phosphorus - (None) (None)  Creatinine 0.57 - 1.00 mg/dL 0.69 0.66  AST 0 - 40 IU/L 14 23  Alk Phos 39 - 117 IU/L 58 75  Bilirubin 0.0 - 1.2 mg/dL 0.4 0.2(L)  Glucose 65 - 99 mg/dL 102(H) 103(H)  Cholesterol - (None) (None)  HDL cholesterol >39 mg/dL 55 (None)  Triglycerides 0 - 149 mg/dL 174(H) (None)  LDL Direct - (None) (None)  LDL Calc 0 - 99 mg/dL 128(H) (None)  Total protein 6.0 - 8.3 g/dL (None) 7.4  Albumin 3.5 - 5.5 g/dL 4.4 4.3   Lab Results  Component Value Date   BUN 13 12/23/2013   No results found for: HGBA1C Lab Results  Component Value Date   TSH 1.430 12/23/2013   Assessment/Plan  1. Dyslipidemia - Lipid panel; Future  2. Palpitations - Comprehensive metabolic panel; Future - EKG 12-Lead; Future  3. Need for prophylactic vaccination and inoculation against influenza - Flu Vaccine QUAD 36+ mos PF IM (Fluarix & Fluzone Quad PF)

## 2015-03-20 ENCOUNTER — Other Ambulatory Visit: Payer: Self-pay

## 2015-03-20 DIAGNOSIS — Z1231 Encounter for screening mammogram for malignant neoplasm of breast: Secondary | ICD-10-CM

## 2015-05-04 ENCOUNTER — Ambulatory Visit: Payer: BLUE CROSS/BLUE SHIELD

## 2015-05-07 ENCOUNTER — Ambulatory Visit
Admission: RE | Admit: 2015-05-07 | Discharge: 2015-05-07 | Disposition: A | Discharge status: Home / Self Care | Payer: BLUE CROSS/BLUE SHIELD | Source: Ambulatory Visit

## 2015-05-07 DIAGNOSIS — Z1231 Encounter for screening mammogram for malignant neoplasm of breast: Secondary | ICD-10-CM

## 2015-08-02 ENCOUNTER — Encounter: Payer: Self-pay | Admitting: Internal Medicine

## 2015-08-02 ENCOUNTER — Ambulatory Visit (INDEPENDENT_AMBULATORY_CARE_PROVIDER_SITE_OTHER): Payer: BLUE CROSS/BLUE SHIELD | Admitting: Internal Medicine

## 2015-08-02 VITALS — BP 128/70 | HR 70 | Temp 97.5°F | Wt 164.0 lb

## 2015-08-02 DIAGNOSIS — L02416 Cutaneous abscess of left lower limb: Secondary | ICD-10-CM | POA: Diagnosis not present

## 2015-08-02 NOTE — Progress Notes (Signed)
[facility] Provider: Johathon Overturf L. Renato Gailseed, D.O., C.M.D. PCP:  Dr. Chilton SiGreen  Goals of Care:  Advanced Directives 01/03/2015  Does patient have an advance directive? Yes  Copy of advanced directive(s) in chart? No - copy requested     Chief Complaint  Patient presents with  . Acute Visit    abcess under thigh    HPI: Patient is a 60 y.o. female seen today for an acute visit for rash with possible abscess under her left thigh.  A few days ago, this began as what looked like an insect bite.  It gradually became more red, raised, painful and developed a white top with a blackhead on it.  She was cleaning it with alcohol and applying neosporin to it.  Last night, when she removed the bandaid, there was some blood on it.  This morning, it feels flatter to her than before.  Past Medical History  Diagnosis Date  . Hematoma 10/06/10    buttock   . Anxiety state, unspecified   . Varicose vein of leg   . Contusion of buttock   . Symptomatic menopausal or female climacteric states   . Thrombophlebitis leg 1990  . Varicose veins     Past Surgical History  Procedure Laterality Date  . Cholecystectomy  December 1990    Dr.Matt Daphine DeutscherMartin  . Endometrial ablation  2006    Dr.Ron Neal   . Colonoscopy  09/14/2008    Diverticulosis, Dr.Medoff   . Varicose vein surgery Left 10/12/2013    [facility]    No Known Allergies    Medication List       This list is accurate as of: 08/02/15  7:49 AM.  Always use your most recent med list.               ELESTRIN 0.52 MG/0.87 GM (0.06%) Gel  Generic drug:  Estradiol  USE 2 PUMPS DAILY AS DIRECTED.     progesterone 100 MG capsule  Commonly known as:  PROMETRIUM  TK 1 C PO QHS        Review of Systems:  Review of Systems  Constitutional: Negative for fever and chills.  Skin:       Sore on left inner upper thigh    Health Maintenance  Topic Date Due  . Hepatitis C Screening  Nov 15, 1955  . HIV Screening   09/23/1971  . INFLUENZA VACCINE  08/14/2015  . PAP SMEAR  05/01/2017  . MAMMOGRAM  05/06/2017  . COLONOSCOPY  09/15/2018  . TETANUS/TDAP  11/24/2021    Physical Exam: Filed Vitals:   08/02/15 0743  BP: 128/70  Pulse: 70  Temp: 97.5 F (36.4 C)  TempSrc: Oral  Weight: 164 lb (74.39 kg)  SpO2: 97%   Body mass index is 25.68 kg/(m^2). Physical Exam  Skin:  Pencil eraser sized area where previous pustule had been with surrounding mild erythema; now area is dry, no longer raised/now flat on exam, mild tenderness, no visible drainage   Assessment/Plan 1. Abscess of left thigh -resolving -sounds like she had a small furuncle that drained yesterday and is now improved -advised to cont alcohol cleansing and neosporin until entirely resolved and [place] with [place]   Labs/tests ordered:  No new Next appt:  01/21/2016 with Dr. Chilton SiGreen and return prn  [name], D.O. Geriatrics [facility] [address] [place], [place] [address] Cell Phone (Mon-Fri 8am-5pm):  [phone] On Call:  [phone] & follow prompts after 5pm & weekends Office Phone:  [phone] Office Fax:  [phone]

## 2015-08-02 NOTE — Patient Instructions (Signed)
Continue cleansing with alcohol and applying neosporin until closed and clears up. [place].

## 2015-08-03 ENCOUNTER — Encounter: Payer: Self-pay | Admitting: Internal Medicine

## 2015-10-24 ENCOUNTER — Ambulatory Visit (INDEPENDENT_AMBULATORY_CARE_PROVIDER_SITE_OTHER): Payer: BLUE CROSS/BLUE SHIELD

## 2015-10-24 DIAGNOSIS — Z23 Encounter for immunization: Secondary | ICD-10-CM

## 2016-01-09 ENCOUNTER — Other Ambulatory Visit: Payer: Self-pay

## 2016-01-09 DIAGNOSIS — R002 Palpitations: Secondary | ICD-10-CM

## 2016-01-09 DIAGNOSIS — E785 Hyperlipidemia, unspecified: Secondary | ICD-10-CM

## 2016-01-21 ENCOUNTER — Other Ambulatory Visit: Payer: Self-pay

## 2016-01-21 DIAGNOSIS — E785 Hyperlipidemia, unspecified: Secondary | ICD-10-CM

## 2016-01-21 DIAGNOSIS — R002 Palpitations: Secondary | ICD-10-CM

## 2016-01-21 LAB — COMPLETE METABOLIC PANEL WITH GFR
ALK PHOS: 52 U/L (ref 33–130)
ALT: 22 U/L (ref 6–29)
AST: 15 U/L (ref 10–35)
Albumin: 4.5 g/dL (ref 3.6–5.1)
BUN: 14 mg/dL (ref 7–25)
CO2: 27 mmol/L (ref 20–31)
Calcium: 9.4 mg/dL (ref 8.6–10.4)
Chloride: 105 mmol/L (ref 98–110)
Creat: 0.73 mg/dL (ref 0.50–1.05)
GFR, Est African American: >89 mL/min (ref >=60)
GFR, Est Non African American: >89 mL/min (ref >=60)
GLUCOSE: 95 mg/dL (ref 65–99)
Potassium: 4.7 mmol/L (ref 3.5–5.3)
Sodium: 141 mmol/L (ref 135–146)
TOTAL PROTEIN: 6.7 g/dL (ref 6.1–8.1)
Total Bilirubin: 0.5 mg/dL (ref 0.2–1.2)

## 2016-01-21 LAB — LIPID PANEL
Cholesterol: 224 mg/dL — ABNORMAL HIGH (ref <200)
HDL: 48 mg/dL — ABNORMAL LOW (ref >50)
LDL Cholesterol: 137 mg/dL — ABNORMAL HIGH (ref <100)
Total CHOL/HDL Ratio: 4.7 Ratio (ref <5.0)
Triglycerides: 194 mg/dL — ABNORMAL HIGH (ref <150)
VLDL: 39 mg/dL — ABNORMAL HIGH (ref <30)

## 2016-01-23 ENCOUNTER — Encounter: Payer: BLUE CROSS/BLUE SHIELD | Admitting: Internal Medicine

## 2016-01-23 ENCOUNTER — Encounter: Payer: Self-pay | Admitting: Internal Medicine

## 2016-01-23 ENCOUNTER — Ambulatory Visit (INDEPENDENT_AMBULATORY_CARE_PROVIDER_SITE_OTHER): Payer: BLUE CROSS/BLUE SHIELD | Admitting: Internal Medicine

## 2016-01-23 VITALS — BP 138/88 | HR 72 | Temp 97.7°F | Ht 66.5 in | Wt 165.0 lb

## 2016-01-23 DIAGNOSIS — F411 Generalized anxiety disorder: Secondary | ICD-10-CM

## 2016-01-23 DIAGNOSIS — Z0000 Encounter for general adult medical examination without abnormal findings: Secondary | ICD-10-CM

## 2016-01-23 DIAGNOSIS — N951 Menopausal and female climacteric states: Secondary | ICD-10-CM

## 2016-01-23 DIAGNOSIS — E785 Hyperlipidemia, unspecified: Secondary | ICD-10-CM | POA: Diagnosis not present

## 2016-01-23 DIAGNOSIS — R002 Palpitations: Secondary | ICD-10-CM

## 2016-01-23 NOTE — Progress Notes (Signed)
HISTORY AND PHYSICAL  Location:    [facility]   Place of Service:   OFFICE  Extended Emergency Contact Information Primary Emergency Contact: Judith Oconnor [place] Phone: [phone] Relation: Significant other Secondary Emergency Contact: Montel Culver, [place] Phone: [phone] Relation: Father  Advanced Directive information Does Patient Have a Medical Advance Directive?: Yes, Type of Advance Directive: Healthcare Power of [place];Living will  Chief Complaint  Patient presents with  . Annual Exam    Physical exam    HPI:  Feeling well.   Palpitations - no chest pain. Rare episodes of rapid beats. Not accompanied by pains or dyspnea.  Dyslipidemia - chronic mild elevation in LDL  Anxiety state - controlled  Symptomatic menopausal or female climacteric states - currently on estrogen and progesterone. Sees Dr. Nori Riis. She is going to try to stop these medications. Has concerns obout potential for side effects. No current symptoms. Denies significant hot flashes or sweats. Active sex life without vaginal irritation or dryness.    Past Medical History:  Diagnosis Date  . Anxiety state, unspecified   . Contusion of buttock   . Hematoma 10/06/10   buttock   . Symptomatic menopausal or female climacteric states   . 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 Varicose vein of leg   . Varicose veins     Past Surgical History:  Procedure Laterality Date  . CHOLECYSTECTOMY  December 1990   Dr.Matt Hassell Done  . COLONOSCOPY  09/14/2008   Diverticulosis, Dr.Medoff   . ENDOMETRIAL ABLATION  2006   [place] SURGERY Left 10/12/2013   Junious Silk -[facility]    Patient Care Team: Estill Dooms, MD as PCP - General (Internal Medicine) Mal Misty, MD as Consulting Physician (Vascular Surgery) W Evette Cristal, MD as Consulting Physician (Obstetrics and Gynecology) Richmond Campbell, MD as  Consulting Physician (Gastroenterology) Johnathan Hausen, MD as Consulting Physician (General Surgery) Clent Jacks, MD as Consulting Physician (Ophthalmology)  Social History   Social History  . Marital status: Widowed    Spouse name: N/A  . Number of children: N/A  . Years of education: N/A   Occupational History  . Not on file.   Social History Main Topics  . Smoking status: Never Smoker  . Smokeless tobacco: Never Used  . Alcohol use 0.0 oz/week    6 - 8 Glasses of wine per week  . Drug use: No  . Sexual activity: Not on file   Other Topics Concern  . Not on file   Social History Narrative   Never smoked   Alcohol 6-8 glasses of wine a week   Exercise 2-3 times a week strength/cardio, 2-3 days walk   POA/Living Will    reports that she has never smoked. She has never used smokeless tobacco. She reports that she drinks alcohol. She reports that she does not use drugs.  Family History  Problem Relation Age of Onset  . Macular degeneration Mother   . Coronary artery disease Father    Family Status  Relation Status  . Mother Alive  . Father Alive  . Sister Alive  . Sister Alive  . Daughter Alive  . Son Alive  . Daughter Alive    Immunization History  Administered Date(s) Administered  . Influenza,inj,Quad PF,36+ Mos 11/30/2012, 01/03/2015, 10/24/2015  . Influenza-Unspecified 11/19/2006, 10/11/2007, 11/25/2011, 10/17/2013  . Tdap 11/25/2011  No Known Allergies  Medications: Patient's Medications  New Prescriptions   No medications on file  Previous Medications   ELESTRIN 0.52 MG/0.87 GM (0.06%) GEL    USE 2 PUMPS DAILY AS DIRECTED.   PROGESTERONE (PROMETRIUM) 100 MG CAPSULE    TK 1 C PO QHS  Modified Medications   No medications on file  Discontinued Medications   No medications on file    Review of Systems  Constitutional: Negative.   HENT: Negative.   Eyes: Negative.   Respiratory: Negative.   Cardiovascular: Negative for chest pain,  palpitations and leg swelling.  Gastrointestinal: Negative.  Negative for abdominal distention, abdominal pain, diarrhea, nausea and vomiting.  Endocrine: Negative.        Using an estrogen gel that is prescribed by Dr. Nori Riis. Continues with hot flashes from time to time despite use of estrogen.  Musculoskeletal: Negative.  Negative for arthralgias, back pain, myalgias, neck pain and neck stiffness.  Skin: Negative.  Negative for color change, pallor, rash and wound.  Allergic/Immunologic: Negative.   Neurological: Negative.   Hematological: Negative.   Psychiatric/Behavioral: Negative.     Vitals:   01/23/16 1120  BP: 138/88  Pulse: 72  Temp: 97.7 F (36.5 C)  TempSrc: Oral  SpO2: 98%  Weight: 165 lb (74.8 kg)  Height: 5' 6.5" (1.689 m)   Body mass index is 26.23 kg/m. Filed Weights   01/23/16 1120  Weight: 165 lb (74.8 kg)     Physical Exam  Constitutional: She is oriented to person, place, and time. She appears well-developed and well-nourished. No distress.  HENT:  Head: Normocephalic and atraumatic.  Right Ear: External ear normal.  Left Ear: External ear normal.  Mouth/Throat: Oropharynx is clear and moist.  Eyes: Conjunctivae are normal. Pupils are equal, round, and reactive to light.  Neck: No JVD present. No tracheal deviation present. No thyromegaly present.  Cardiovascular: Normal rate, regular rhythm, normal heart sounds and intact distal pulses.  Exam reveals no gallop and no friction rub.   No murmur heard. Varicosities of the left leg. History of phlebitis of the left leg in 1990.  Pulmonary/Chest: No respiratory distress. She has no wheezes. She has no rales. She exhibits no tenderness.  Abdominal: She exhibits no distension and no mass. There is no tenderness.  Genitourinary:  Genitourinary Comments: Dr. Nori Riis  Musculoskeletal: Normal range of motion. She exhibits no edema or tenderness.  Lymphadenopathy:    She has no cervical adenopathy.    Neurological: She is alert and oriented to person, place, and time. She has normal reflexes. No cranial nerve deficit. Coordination normal.  Skin: No rash noted. No erythema. No pallor.  Continues to have a firm tissue change in the left buttock as a result of the prior large hematoma in this area.  Psychiatric: She has a normal mood and affect. Her behavior is normal. Judgment and thought content normal.    Labs reviewed: Lab Summary Latest Ref Rng & Units 01/21/2016 12/23/2013  Hemoglobin 13.0-17.0 g/dL (None) (None)  Hematocrit 39.0-52.0 % (None) (None)  White count - (None) (None)  Platelet count - (None) (None)  Sodium 135 - 146 mmol/L 141 142  Potassium 3.5 - 5.3 mmol/L 4.7 4.5  Calcium 8.6 - 10.4 mg/dL 9.4 9.3  Phosphorus - (None) (None)  Creatinine 0.50 - 1.05 mg/dL 0.73 0.69  AST 10 - 35 U/L 15 14  Alk Phos 33 - 130 U/L 52 58  Bilirubin 0.2 - 1.2 mg/dL 0.5 0.4  Glucose 65 -  99 mg/dL 95 102(H)  Cholesterol <200 mg/dL 224(H) (None)  HDL cholesterol >50 mg/dL 48(L) 55  Triglycerides <150 mg/dL 194(H) 174(H)  LDL Direct - (None) (None)  LDL Calc <100 mg/dL 137(H) 128(H)  Total protein 6.1 - 8.1 g/dL 6.7 (None)  Albumin 3.6 - 5.1 g/dL 4.5 4.4  Some recent data might be hidden   Lab Results  Component Value Date   BUN 14 01/21/2016   No results found for: HGBA1C Lab Results  Component Value Date   TSH 1.430 12/23/2013    01/23/16 EKG: rate 75. NSR. Incomplete RBBB.  Assessment/Plan  1. Palpitations rare - EKG 12-Lead  2. Dyslipidemia No medication yet. Recommend weight loss of 10-15#. Discusssed diet  3. Anxiety state controlled  4. Symptomatic menopausal or female climacteric states She will try to go off estrogen. She is to see Dr. Nori Riis in April and will discuss this issue with him then.

## 2016-03-13 ENCOUNTER — Encounter: Payer: Self-pay | Admitting: Sports Medicine

## 2016-03-13 ENCOUNTER — Ambulatory Visit (INDEPENDENT_AMBULATORY_CARE_PROVIDER_SITE_OTHER): Payer: BLUE CROSS/BLUE SHIELD | Admitting: Sports Medicine

## 2016-03-13 VITALS — BP 129/69 | HR 76 | Ht 67.0 in | Wt 165.0 lb

## 2016-03-13 DIAGNOSIS — M79671 Pain in right foot: Secondary | ICD-10-CM | POA: Diagnosis not present

## 2016-03-13 NOTE — Patient Instructions (Signed)
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 Jumping will aggravate the irritation in your foot

## 2016-03-13 NOTE — Progress Notes (Signed)
   Subjective:    Patient ID: Judith Oconnor, female    DOB: 09/25/1955, 61 y.o.   MRN: [id number]   CC: right foot pain  HPI: 61 y/o F with right foot apin  Right foot pain - worse on right heel - has been present fro at least 6 months - she walks and does an aerobics class for exercise - pain with worse with jumping - denies swelling but does note that the back of her right heel is larger than her left - denies weakness, trauma or inciting events to make the pain occur  Pertinent past medical history: none  Review of Systems  Per HPI,     Objective:  BP 129/69   Pulse 76   Ht 5\' 7"  (1.702 m)   Wt 165 lb (74.8 kg)   BMI 25.84 kg/m  Vitals and nursing note reviewed  General: NAD MSK: prominence of the bilateral posterior calcaneous bones, mild tenderness to palpation of the right, no swelling or effusion noted, nromal ROM of the ankles bilaterally   US Limited beside US of the right foot shows a prominent spur off of the posterior calcaneus with some hypoechoic changes of the Achilles tendon insertion. Remainder of the Achilles tendon is unremarkable. Spur is seen in both long and short axis views.  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 There was some bone spurring which is likely contributing - will obtain Xray of the foot - patient to use aspercreme for pain - she will be given shoe inserts - limits jumping type exercises    Zaven Klemens A. Kennon RoundsHaney MD, MS Family Medicine Resident PGY-3 Pager [phone]  Patient seen and evaluated with the resident. I agree with the above plan of care. Patient's right heel pain appears to be due to insertional Achilles tendinopathy. Today's ultrasound shows what appears to be a rather prominent spur off of the calcaneus. I would like to evaluate this further with x-ray. Patient is instructed to avoid any sort of exercise that involves jumping. She is given some 5/16 inch  heel lifts to wear with activity and she may continue with other exercise as symptoms allow. I recommended topical Aspercreme and icing daily. Patient will follow-up with me in 6 weeks for reevaluation. I briefly discussed adding eccentric exercises at follow-up if her symptoms persist. We could also consider nitroglycerin patches. I explained to the patient that this is a difficult problem to resolve and it may take several months for symptom resolution (her symptoms have been present now since the fall of 2017).

## 2016-03-13 NOTE — Assessment & Plan Note (Signed)
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 There was some bone spurring which is likely contributing - will obtain Xray of the foot - patient to use aspercreme for pain - she will be given shoe inserts - limits jumping type exercises

## 2016-03-17 ENCOUNTER — Ambulatory Visit
Admission: RE | Admit: 2016-03-17 | Discharge: 2016-03-17 | Disposition: A | Discharge status: Home / Self Care | Payer: BLUE CROSS/BLUE SHIELD | Source: Ambulatory Visit | Attending: Sports Medicine | Admitting: Sports Medicine

## 2016-03-17 DIAGNOSIS — M79671 Pain in right foot: Secondary | ICD-10-CM

## 2016-03-19 ENCOUNTER — Telehealth: Payer: Self-pay | Admitting: Sports Medicine

## 2016-03-19 NOTE — Telephone Encounter (Signed)
  I left a message today on the patient's voicemail regarding x-ray findings of her right calcaneus. X-ray confirms what was seen on ultrasound. She has a moderate size spur at the calcaneal insertion of the Achilles tendon. She will continue with treatment as discussed at her last office visit and will follow-up with me as scheduled.

## 2016-03-26 ENCOUNTER — Encounter: Payer: Self-pay | Admitting: Internal Medicine

## 2016-03-27 ENCOUNTER — Other Ambulatory Visit: Payer: Self-pay | Admitting: Obstetrics & Gynecology

## 2016-03-27 DIAGNOSIS — Z1231 Encounter for screening mammogram for malignant neoplasm of breast: Secondary | ICD-10-CM

## 2016-04-15 ENCOUNTER — Encounter: Payer: Self-pay | Admitting: Internal Medicine

## 2016-04-15 ENCOUNTER — Ambulatory Visit (INDEPENDENT_AMBULATORY_CARE_PROVIDER_SITE_OTHER): Payer: BLUE CROSS/BLUE SHIELD | Admitting: Internal Medicine

## 2016-04-15 VITALS — BP 134/76 | HR 70 | Temp 97.5°F | Ht 66.5 in | Wt 167.0 lb

## 2016-04-15 DIAGNOSIS — R350 Frequency of micturition: Secondary | ICD-10-CM | POA: Diagnosis not present

## 2016-04-15 DIAGNOSIS — R319 Hematuria, unspecified: Secondary | ICD-10-CM | POA: Diagnosis not present

## 2016-04-15 LAB — POCT URINALYSIS DIPSTICK
Bilirubin, UA: NEGATIVE
Glucose, UA: NEGATIVE
Ketones, UA: NEGATIVE
NITRITE UA: NEGATIVE
Spec Grav, UA: 1.03 (ref 1.030–1.035)
Urobilinogen, UA: NEGATIVE (ref ?–2.0)
pH, UA: 5 (ref 5.0–8.0)

## 2016-04-15 MED ORDER — AMOXICILLIN-POT CLAVULANATE 875-125 MG PO TABS: ORAL_TABLET | ORAL | 0 refills | Status: DC

## 2016-04-15 NOTE — Progress Notes (Signed)
Facility  [facility]    Place of Service:   OFFICE    No Known Allergies  Chief Complaint  Patient presents with  . Urinary Frequency    and burning with urination, started yesterday    HPI:  24 hours of symptoms: frequency  dysuria. No nausea or fever. Stopped estrogen cream earlier this year. Does not want to resume it.  Medications: Patient's Medications  New Prescriptions   No medications on file  Previous Medications   IBUPROFEN (ADVIL,MOTRIN) 200 MG TABLET    Take 200 mg by mouth every 8 (eight) hours as needed.  Modified Medications   No medications on file  Discontinued Medications   No medications on file    Review of Systems  Constitutional: Negative.   HENT: Negative.   Eyes: Negative.   Respiratory: Negative.   Cardiovascular: Negative for chest pain, palpitations and leg swelling.  Gastrointestinal: Negative.  Negative for abdominal distention, abdominal pain, diarrhea, nausea and vomiting.  Endocrine: Negative.        Using an estrogen gel that is prescribed by Dr. Nori Riis. Continues with hot flashes from time to time despite use of estrogen.  Genitourinary: Positive for dysuria and frequency.  Musculoskeletal: Negative.  Negative for arthralgias, back pain, myalgias, neck pain and neck stiffness.  Skin: Negative.  Negative for color change, pallor, rash and wound.  Allergic/Immunologic: Negative.   Neurological: Negative.   Hematological: Negative.   Psychiatric/Behavioral: Negative.     Vitals:   04/15/16 1438  BP: 134/76  Pulse: 70  Temp: 97.5 F (36.4 C)  TempSrc: Oral  SpO2: 96%  Weight: 167 lb (75.8 kg)  Height: 5' 6.5" (1.689 m)   Body mass index is 26.55 kg/m. Wt Readings from Last 3 Encounters:  04/15/16 167 lb (75.8 kg)  03/13/16 165 lb (74.8 kg)  01/23/16 165 lb (74.8 kg)      Physical Exam  Constitutional: She is oriented to person, place, and time. She appears well-developed and well-nourished. No distress.  HENT:  Head:  Normocephalic and atraumatic.  Right Ear: External ear normal.  Left Ear: External ear normal.  Mouth/Throat: Oropharynx is clear and moist.  Eyes: Conjunctivae are normal. Pupils are equal, round, and reactive to light.  Neck: No JVD present. No tracheal deviation present. No thyromegaly present.  Cardiovascular: Normal rate, regular rhythm, normal heart sounds and intact distal pulses.  Exam reveals no gallop and no friction rub.   No murmur heard. Varicosities of the left leg. History of phlebitis of the left leg in 1990.  Pulmonary/Chest: No respiratory distress. She has no wheezes. She has no rales. She exhibits no tenderness.  Abdominal: She exhibits no distension and no mass. There is no tenderness.  Genitourinary:  Genitourinary Comments: Dr. Nori Riis  Musculoskeletal: Normal range of motion. She exhibits no edema or tenderness.  Lymphadenopathy:    She has no cervical adenopathy.  Neurological: She is alert and oriented to person, place, and time. She has normal reflexes. No cranial nerve deficit. Coordination normal.  Skin: No rash noted. No erythema. No pallor.  Continues to have a firm tissue change in the left buttock as a result of the prior large hematoma in this area.  Psychiatric: She has a normal mood and affect. Her behavior is normal. Judgment and thought content normal.    Labs reviewed: Lab Summary Latest Ref Rng & Units 01/21/2016 12/23/2013  Hemoglobin 13.0-17.0 g/dL (None) (None)  Hematocrit 39.0-52.0 % (None) (None)  White count - (None) (  None)  Platelet count - (None) (None)  Sodium 135 - 146 mmol/L 141 142  Potassium 3.5 - 5.3 mmol/L 4.7 4.5  Calcium 8.6 - 10.4 mg/dL 9.4 9.3  Phosphorus - (None) (None)  Creatinine 0.50 - 1.05 mg/dL 0.73 0.69  AST 10 - 35 U/L 15 14  Alk Phos 33 - 130 U/L 52 58  Bilirubin 0.2 - 1.2 mg/dL 0.5 0.4  Glucose 65 - 99 mg/dL 95 102(H)  Cholesterol <200 mg/dL 224(H) (None)  HDL cholesterol >50 mg/dL 48(L) 55  Triglycerides <150  mg/dL 194(H) 174(H)  LDL Direct - (None) (None)  LDL Calc <100 mg/dL 137(H) 128(H)  Total protein 6.1 - 8.1 g/dL 6.7 (None)  Albumin 3.6 - 5.1 g/dL 4.5 4.4  Some recent data might be hidden   Lab Results  Component Value Date   TSH 1.430 12/23/2013   Lab Results  Component Value Date   BUN 14 01/21/2016   BUN 13 12/23/2013   BUN 14 08/14/2010   No results found for: HGBA1C  Assessment/Plan  1. Frequency of urination - POC Urinalysis Dipstick - Culture, Urine - amoxicillin-clavulanate (AUGMENTIN) 875-125 MG tablet; One twice daily for infection  Dispense: 14 tablet; Refill: 0  2. Hematuria, unspecified type - Culture, Urine

## 2016-04-18 LAB — URINE CULTURE

## 2016-04-29 ENCOUNTER — Encounter: Payer: Self-pay | Admitting: Sports Medicine

## 2016-04-29 ENCOUNTER — Ambulatory Visit (INDEPENDENT_AMBULATORY_CARE_PROVIDER_SITE_OTHER): Payer: BLUE CROSS/BLUE SHIELD | Admitting: Sports Medicine

## 2016-04-29 VITALS — BP 111/50 | Ht 67.0 in | Wt 165.0 lb

## 2016-04-29 DIAGNOSIS — M9261 Juvenile osteochondrosis of tarsus, right ankle: Secondary | ICD-10-CM

## 2016-04-29 DIAGNOSIS — M79671 Pain in right foot: Secondary | ICD-10-CM

## 2016-04-29 NOTE — Progress Notes (Signed)
   Subjective:    Patient ID: Judith Oconnor, female    DOB: 08-Feb-1955, 61 y.o.   MRN: [id number]  HPI   Patient comes in today for follow-up on right heel insertional Achilles tendinopathy. An x-ray shows a rather prominent calcaneal spur in this area. Overall, her symptoms are tolerable. She has found the heel lifts to be helpful. She has also found Aspercreme to be somewhat helpful. She only has some mild limitation of her activities.    Review of Systems    as above Objective:   Physical Exam  Well-developed, well-nourished. No acute distress  Right heel: Prominent Haglund's deformity. Minimal tenderness to palpation. No soft tissue swelling. Neurovascularly intact distally. Walking without a limp.      Assessment & Plan:   Haglund's deformity, right heel  Patient's symptoms are tolerable. She is certainly not interested in anything surgical. I agree. I've given her a couple of additional pairs of heel lifts to put in other shoes. In addition to Aspercreme she can also ice the area as needed. I think she is safe to continue with activity using pain as her guide. Follow-up with me as needed.  Of note, she was also complaining of some left shoulder pain. A quick exam today showed findings consistent with rotator cuff impingement. Good rotator cuff strength. No labral signs. I've given her some Jobe home exercises to start doing. If symptoms persist then she will return to the office for consideration of a subacromial cortisone injection.

## 2016-05-08 ENCOUNTER — Ambulatory Visit
Admission: RE | Admit: 2016-05-08 | Discharge: 2016-05-08 | Disposition: A | Discharge status: Home / Self Care | Payer: BLUE CROSS/BLUE SHIELD | Source: Ambulatory Visit | Attending: Obstetrics & Gynecology | Admitting: Obstetrics & Gynecology

## 2016-05-08 DIAGNOSIS — Z1231 Encounter for screening mammogram for malignant neoplasm of breast: Secondary | ICD-10-CM

## 2016-06-11 LAB — HM DEXA SCAN: HM DEXA SCAN: NORMAL

## 2016-06-12 ENCOUNTER — Encounter: Payer: Self-pay | Admitting: Internal Medicine

## 2016-07-22 ENCOUNTER — Encounter: Payer: Self-pay | Admitting: Sports Medicine

## 2016-07-22 ENCOUNTER — Ambulatory Visit (INDEPENDENT_AMBULATORY_CARE_PROVIDER_SITE_OTHER): Payer: BLUE CROSS/BLUE SHIELD | Admitting: Sports Medicine

## 2016-07-22 VITALS — BP 120/70 | Ht 67.0 in | Wt 165.0 lb

## 2016-07-22 DIAGNOSIS — M25512 Pain in left shoulder: Secondary | ICD-10-CM | POA: Diagnosis not present

## 2016-07-22 MED ORDER — METHYLPREDNISOLONE ACETATE 40 MG/ML IJ SUSP
40.0000 mg | Freq: Once | INTRAMUSCULAR | Status: AC
  Administered 2016-07-22: 40 mg via INTRA_ARTICULAR

## 2016-07-22 NOTE — Progress Notes (Signed)
   Subjective:    Patient ID: Judith Oconnor, female    DOB: 09-25-1955, 61 y.o.   MRN: [id number]  HPI chief complaint: Left shoulder pain  Patient is a right-hand-dominant 61 year old female that comes in today complaining of several weeks of left shoulder pain. We briefly discussed this during her last office visit. She was given some rotator cuff exercises but has not been doing them. Her pain is diffuse along the lateral shoulder and worse with any sort of overhead movement or reaching behind her back. She denies any trauma. No numbness or tingling. No weakness. No problems with this shoulder in the past. No prior shoulder surgeries.  Interim medical history reviewed Medications reviewed Allergies reviewed    Review of Systems    as above Objective:   Physical Exam  Well-developed, well-nourished. No acute distress. Awake alert and oriented 3. Vital signs reviewed  Left shoulder: Patient has limited range of motion actively in all planes. She has a slight amount of decreased external rotation passively compared to the right arm. Negative empty can, positive Hawkins. Rotator cuff strength is 5/5. Negative O'Brien's. No tenderness over the acromioclavicular joint. No tenderness over the bicipital groove. Neurovascularly intact distally.      Assessment & Plan:   Left shoulder pain likely secondary to subacromial bursitis/rotator cuff tendinitis Possible early adhesive capsulitis  For diagnostic as well as therapeutic reasons, I've elected to inject the patient's subacromial space today with cortisone. She tolerated this without difficulty. She will resume her Jobe home exercises and will add in some wall walks just in case she is starting to develop early adhesive capsulitis. She is to follow-up with me again in 3 weeks. If symptoms persist then I would start with an ultrasound evaluation of her shoulder. She may also need an intra-articular cortisone injection if she is developing  adhesive capsulitis. She will avoid repetitive overhead activity and will call me with questions or concerns prior to her follow-up visit.  Consent obtained and verified. Time-out conducted. Noted no overlying erythema, induration, or other signs of local infection. Skin prepped in a sterile fashion. Topical analgesic spray: Ethyl chloride. Joint: left shoulder (subacromial) Needle: 25g 1.5 inch Completed without difficulty. Meds: 3cc 1% xylocaine, 1cc (40mg ) depomedrol  Advised to call if fevers/chills, erythema, induration, drainage, or persistent bleeding.

## 2016-07-25 ENCOUNTER — Telehealth: Payer: BLUE CROSS/BLUE SHIELD | Admitting: Nurse Practitioner

## 2016-07-25 DIAGNOSIS — H5789 Other specified disorders of eye and adnexa: Secondary | ICD-10-CM

## 2016-07-25 NOTE — Progress Notes (Signed)
Based on what you shared with me it looks like you have a serious condition that should be evaluated in a face to face office visit.   * conjunctivitis should have cleared up by now with antibiotic drops. It is either just viral infection in which drops will not help. It will just have to run its course. Or something else. You will need to make appointment to see eye doctor as soon as possible.     NOTE: Even if you have entered your credit card information for this eVisit, you will not be charged.   If you are having a true medical emergency please call 911.  If you need an urgent face to face visit, [facility] has four urgent care centers for your convenience.  If you need care fast and have a high deductible or no insurance consider:   [email or web address]  [phone]  [address], [address] [place], [place] [address] 8 am to 8 pm Monday-Friday 10 am to 4 pm Saturday-Sunday   The following sites will take your  insurance:    . [facility]  [phone] Get Driving Directions Find a Provider at this Location  [address] [place], [place] [address] . 10 am to 8 pm Monday-Friday . 12 pm to 8 pm Saturday-Sunday   . [facility]  [phone] Get Driving Directions Find a Provider at this Location  [address], [address] [place], [place] [address] . 8 am to 8 pm Monday-Friday . 9 am to 6 pm Saturday . 11 am to 6 pm Sunday   . [facility] [place]  [phone] Get Driving Directions  [address] [place], [place] [address] . 8 am to 8 pm Monday-Friday . 8 am to 4 pm Saturday-Sunday   Your e-visit answers were reviewed by a board certified advanced clinical practitioner to complete your personal care plan.  Thank you for using e-Visits.

## 2016-08-21 ENCOUNTER — Encounter: Payer: Self-pay | Admitting: Sports Medicine

## 2016-08-21 ENCOUNTER — Ambulatory Visit (INDEPENDENT_AMBULATORY_CARE_PROVIDER_SITE_OTHER): Payer: BLUE CROSS/BLUE SHIELD | Admitting: Sports Medicine

## 2016-08-21 VITALS — BP 120/62 | Ht 67.0 in | Wt 160.0 lb

## 2016-08-21 DIAGNOSIS — M25512 Pain in left shoulder: Secondary | ICD-10-CM | POA: Diagnosis not present

## 2016-08-21 MED ORDER — MELOXICAM 15 MG PO TABS: ORAL_TABLET | ORAL | 1 refills | Status: DC

## 2016-08-21 NOTE — Patient Instructions (Signed)
  We will schedule an ultrasound of your shoulder to be done next week  Try the meloxicam in the meantime  If the ultrasound does not show a rotator cuff tear, I will probably perform a cortisone injection directly into the shoulder joint under ultrasound guidance.

## 2016-08-22 NOTE — Progress Notes (Signed)
   Subjective:    Patient ID: Judith Oconnor, female    DOB: 06-Aug-1955, 61 y.o.   MRN: [id number]  HPI patient comes in today for follow-up on left shoulder pain. Pain persists despite a recent subacromial cortisone injection. It is along the lateral and posterior aspect of her shoulder. She is having pain with overhead motion and reaching around behind her back. She's having pain at night as well. She has been doing her range of motion exercises and rotator cuff strengthening exercises but they do not seem to be helping. She does take 400 mg of Motrin twice daily and does note some benefit from this. She denies numbness or tingling.    Review of Systems As above    Objective:   Physical Exam  Well-developed, well-nourished. No acute distress  Left shoulder: Patient has limited abduction actively to about 90. Passive abduction is also limited to about 90. She has active and passive forward flexion to 160. Internal rotation limited to 80. External rotation is 80 passively and actively. Mildly positive empty can, mildly positive Hawkins. She has 5 out of 5 rotator cuff strength but does have producible pain with resisted external rotation. No tenderness over the bicipital groove and no tenderness to palpation at the acromioclavicular joint. Neurovascularly intact distally.      Assessment & Plan:   Left shoulder pain likely secondary to adhesive capsulitis vs infraspinatus tendinopathy/tear  I initially thought she may have some bursitis or rotator cuff tendinitis but she has not benefited from a subacromial cortisone injection or a Jobe home exercise program. I will schedule the patient for an ultrasound of her left shoulder to be done sometime next week. We will evaluate the integrity of her rotator cuff and look for signs of impingement and adhesive capsulitis. In the meantime, we will try her on meloxicam 15 mg daily with food for 7 days then as needed. If adhesive capsulitis is  confirmed on her ultrasound next week then we will likely proceed with an intra-articular cortisone injection at that time. She will discontinue her strengthening exercises and concentrate primarily on wall walks and pendulum exercises for motion.

## 2016-08-29 ENCOUNTER — Ambulatory Visit (INDEPENDENT_AMBULATORY_CARE_PROVIDER_SITE_OTHER): Payer: BLUE CROSS/BLUE SHIELD | Admitting: Sports Medicine

## 2016-08-29 VITALS — BP 130/82 | Ht 67.0 in | Wt 160.0 lb

## 2016-08-29 DIAGNOSIS — M7502 Adhesive capsulitis of left shoulder: Secondary | ICD-10-CM | POA: Diagnosis not present

## 2016-08-29 MED ORDER — AMITRIPTYLINE HCL 25 MG PO TABS: 25.0000 mg | ORAL_TABLET | Freq: Every day | ORAL | 1 refills | Status: DC

## 2016-08-29 NOTE — Progress Notes (Signed)
   Subjective:    Patient ID: Judith Oconnor, female    DOB: [birth year], 61 y.o.   MRN: [id number]  HPI   Patient comes in today for follow-up on her left shoulder. She is here today for an ultrasound of her left shoulder. We started her on meloxicam last week. This has been somewhat helpful but she still having pain.   Review of Systems     Objective:   Physical Exam  Well-developed, well-nourished.  Left shoulder: Patient still has limited active and passive abduction. External rotation is only mildly limited actively and passively. Internal rotation does cough some pain. Rotator cuff strength is 5/5. Neurovascularly intact distally.  [facility] ultrasound of the left shoulder was performed today. Biceps tendon was well visualized in the bicipital groove. It is unremarkable. Acromioclavicular joint also unremarkable. Supraspinatus, infraspinatus, and subscapularis are all well-visualized and do not show any obvious tears. No obvious signs of bursitis. No signs of impingement with dynamic testing. Findings consistent with a normal ultrasound of the left shoulder.      Assessment & Plan:   Left shoulder pain likely secondary to early adhesive capsulitis  Ultrasound does not show any definitive rotator cuff tear. I want the patient to start formal physical therapy with instructions for the therapist to do range of motion exercises only. No strengthening exercises yet. I also want to start the patient on amitriptyline 25 mg nightly. She can also continue with her meloxicam as needed. I want her to avoid any exercise at the gym that does not involve her elbows being directly by her side. She'll follow-up with me again in 4 weeks. She will get an x-ray of her left shoulder prior to that visit. If symptoms persist follow-up then we may need to consider further diagnostic imaging.

## 2016-08-29 NOTE — Patient Instructions (Addendum)
  Your ultrasound did not show an obvious rotator cuff tear.  This looks to me like an early adhesive capsulitis (frozen shoulder).  Let's try a little amitriptyline at night. You can also use meloxicam as needed for pain.  I want you to start formal physical therapy. They should call you next week with an appointment.  The only upper body exercises you can do exercises are with your elbowsIis at your side.  Get an x-ray of your shoulder at [facility] imaging before you see me again in 4 weeks.  If your symptoms are worsening we may need to consider an MRI.  Feel free to call me with any questions you may have.

## 2016-09-03 ENCOUNTER — Ambulatory Visit: Payer: BLUE CROSS/BLUE SHIELD | Attending: Sports Medicine | Admitting: Physical Therapy

## 2016-09-03 DIAGNOSIS — M25612 Stiffness of left shoulder, not elsewhere classified: Secondary | ICD-10-CM | POA: Diagnosis present

## 2016-09-03 DIAGNOSIS — R293 Abnormal posture: Secondary | ICD-10-CM | POA: Diagnosis present

## 2016-09-03 DIAGNOSIS — M25512 Pain in left shoulder: Secondary | ICD-10-CM | POA: Diagnosis not present

## 2016-09-03 NOTE — Therapy (Signed)
[facility] [address] [place], [place], [address] Phone: [phone]   Fax:  [phone]  Physical Therapy Evaluation  Patient Details  Name: Judith Oconnor MRN: [id number] Date of Birth: 11-25-1955 Referring Provider: Dr. Reino Bellis  Encounter Date: 09/03/2016      PT End of Session - 09/03/16 1311    Visit Number 1   Number of Visits 12   Date for PT Re-Evaluation 10/15/16   Authorization Type BCBS 30 visit limit   PT Start Time 1230   PT Stop Time 1310   PT Time Calculation (min) 40 min   Activity Tolerance Patient tolerated treatment well   Behavior During Therapy [facility] for tasks assessed/performed      Past Medical History:  Diagnosis Date  . Anxiety state, unspecified   . Contusion of buttock   . Hematoma 10/06/10   buttock   . Symptomatic menopausal or female climacteric states   . Thrombophlebitis leg 1990  . Varicose vein of leg   . Varicose veins     Past Surgical History:  Procedure Laterality Date  . CHOLECYSTECTOMY  December 1990   Dr.Matt Daphine Deutscher  . COLONOSCOPY  09/14/2008   Diverticulosis, Dr.Medoff   . ENDOMETRIAL ABLATION  2006   Dr.Ron Neal   . VARICOSE VEIN SURGERY Left 10/12/2013   [facility]ning [facility]    There were no vitals filed for this visit.       Subjective Assessment - 09/03/16 1234    Subjective Pt is a 61 y/o female who presents to OPPT for Lt shoulder pain and decreased ROM.  Pt reports symptoms began ~ 3-4 months ago with unknown cause of injury.  Pt reports she has since changed fitness centers since pain began, but doesn't necessarily attribute pain to this.   Pertinent History anxiety   Limitations House hold activities;Lifting   Diagnostic tests U/S Lt shoulder: normal   Patient Stated Goals improve ROM, decrease pain, improve sleep; get back to regular exercise   Currently in Pain? Yes   Pain Score 0-No pain  up to 3/10   Pain Location Shoulder   Pain Orientation Left   Pain Descriptors / Indicators Tightness   Pain Type Chronic pain;Acute pain   Pain Onset More than a month ago   Pain Frequency Intermittent   Aggravating Factors  abduction, flexion, internal rotation   Pain Relieving Factors mobic   Effect of Pain on Daily Activities sleeping            [facility] PT Assessment - 09/03/16 1239      Assessment   Medical Diagnosis Lt adhesive capsulitis   Referring Provider Dr. Reino Bellis   Onset Date/Surgical Date --  4-5 months ago   Hand Dominance Right   Next MD Visit 09/29/16   Prior Therapy unrelated to this condition     Precautions   Precautions None   Precaution Comments no strengthening yet per order; ROM and modalities only     Restrictions   Weight Bearing Restrictions No     Balance Screen   Has the patient fallen in the past 6 months No   Has the patient had a decrease in activity level because of a fear of falling?  No   Is the patient reluctant to leave their home because of a fear of falling?  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 book club, exercise: workout 2 monrings/wk, then walk other weeks (currently exercising with Tiajuana Amass, personal trainer at [employer])     Cognition   Overall Cognitive Status Within Functional Limits for tasks assessed     Observation/Other Assessments   Focus on Therapeutic Outcomes (FOTO)  55 (45% limited; predicted 31% limited)     Posture/Postural Control   Posture/Postural Control Postural limitations   Postural Limitations Rounded Shoulders;Forward head     ROM / Strength   AROM / PROM / Strength AROM;PROM     AROM   AROM Assessment Site Shoulder   Right/Left Shoulder Left   Left Shoulder Flexion 105 Degrees   Left  Shoulder ABduction 90 Degrees   Left Shoulder Internal Rotation 80 Degrees   Left Shoulder External Rotation 44 Degrees     PROM   PROM Assessment Site Shoulder   Right/Left Shoulder Left   Left Shoulder Flexion 111 Degrees   Left Shoulder ABduction 95 Degrees   Left Shoulder External Rotation 56 Degrees     Strength   Overall Strength Comments deferred at this time due to referral     Palpation   Palpation comment tenderness and trigger points noted in Lt upper trap, infraspinatus and teres minor            Objective measurements completed on examination: See above findings.          [facility] Adult PT Treatment/Exercise - 09/03/16 1239      Exercises   Exercises Shoulder     Shoulder Exercises: Supine   External Rotation Left;AAROM   External Rotation Limitations with cane; 1-2 reps for HEP instruction   Flexion Left;AAROM   Flexion Limitations with cane; 1-2 reps for HEP instruction   ABduction Left;AAROM   ABduction Limitations with cane; 1-2 reps for HEP instruction     Manual Therapy   Manual Therapy Passive ROM   Passive ROM Lt shoulder flexion, abduction and external rotation with end range holds                PT Education - 09/03/16 1310    Education provided Yes   Education Details HEP, POC, goals of care   Person(s) Educated Patient   Methods Explanation;Demonstration;Handout   Comprehension Verbalized understanding;Returned demonstration;Need further instruction             PT Long Term Goals - 09/03/16 1330      PT LONG TERM GOAL #1   Title independent with HEP   Time 6   Period Weeks   Status New   Target Date 10/15/16     PT LONG TERM GOAL #2   Title improve Lt shoulder flexion and abduction to at least 120 degrees actively for improved UE use   Time 6   Period Weeks   Status New   Target Date 10/15/16     PT LONG TERM GOAL #3   Title report 75% improvement in sleep for improved tolerance and pain   Time 6   Period  Weeks   Status New   Target Date 10/15/16     PT LONG TERM GOAL #4   Title demonstrate 5/5 shoulder strength (if pt has been able to perform strengthening) for improved function   Time 6   Period Weeks   Status New   Target Date  10/15/16     PT LONG TERM GOAL #5   Title return to regular gym exercise without modification for improved function and strength   Time 6   Period Weeks   Status New   Target Date 10/15/16                Plan - 09/03/16 1312    Clinical Impression Statement Pt is a 61 y/o female who presents to OPPT for Lt shoulder pain and stiffness.  Pt demonstrates decreased ROM, postural abnormalities as well as active trigger points affecting function.  Suspect strength is also affected but deferred at this time per order from MD.  Pt will benefit from PT to address deficits listed.   Clinical Presentation Stable   Clinical Decision Making Low   Rehab Potential Good   PT Frequency 2x / week   PT Duration 6 weeks   PT Treatment/Interventions ADLs/Self Care Home Management;Cryotherapy;Electrical Stimulation;Moist Heat;Ultrasound;Therapeutic exercise;Therapeutic activities;Functional mobility training;Patient/family education;Manual techniques;Passive range of motion;Vasopneumatic Device;Dry needling   PT Next Visit Plan aggressive ROM, consider TDN is MD okay with proceeding, modalities PRN   Consulted and Agree with Plan of Care Patient      Patient will benefit from skilled therapeutic intervention in order to improve the following deficits and impairments:  Impaired flexibility, Postural dysfunction, Decreased range of motion, Decreased strength, Pain, Impaired UE functional use, Increased muscle spasms, Increased fascial restricitons  Visit Diagnosis: Left shoulder pain, unspecified chronicity - Plan: PT plan of care cert/re-cert  Stiffness of left shoulder, not elsewhere classified - Plan: PT plan of care cert/re-cert  Abnormal posture - Plan: PT plan  of care cert/re-cert     Problem List Patient Active Problem List   Diagnosis Date Noted  . Right foot pain 03/13/2016  . Dyslipidemia 01/03/2015  . Palpitations 01/03/2015  . Varicose veins of leg with complications 10/18/2013  . Symptomatic menopausal or female climacteric states 12/22/2012  . Anxiety state       Clarita Crane, PT, DPT 09/03/16 1:35 PM    [facility] [address] [place], [place], [address] Phone: [phone]   Fax:  [phone]  Name: Judith Oconnor MRN: [id number] Date of Birth: 07-11-1955

## 2016-09-03 NOTE — Patient Instructions (Signed)
SHOULDER: External Rotation - Supine (Cane)   Hold cane with both hands. Rotate arm away from body. Keep elbow on floor and next to body. _10__ reps per set, __2-3_ sets per day, _7__ days per week Add towel to keep elbow at side.    Cane Exercise: Flexion   Lie on back, holding cane above chest. Keeping arms as straight as possible, lower cane toward floor beyond head. Hold _1-2___ seconds. Repeat _10___ times. Do __2-3__ sessions per day.   Cane Exercise: Abduction    Hold cane with right hand over end, palm-up, with other hand palm-down. Move arm out from side and up by pushing with other arm. Hold __1-2__ seconds. Repeat __10__ times. Do __2-3__ sessions per day.

## 2016-09-05 ENCOUNTER — Ambulatory Visit: Payer: BLUE CROSS/BLUE SHIELD | Admitting: Rehabilitation

## 2016-09-05 DIAGNOSIS — M25612 Stiffness of left shoulder, not elsewhere classified: Secondary | ICD-10-CM

## 2016-09-05 DIAGNOSIS — M25512 Pain in left shoulder: Secondary | ICD-10-CM

## 2016-09-05 NOTE — Therapy (Signed)
[facility] [address] [place], [place], [address] Phone: [phone]   Fax:  [phone]  Physical Therapy Treatment  Patient Details  Name: Judith Oconnor MRN: [id number] Date of Birth: September 28, 1955 Referring Provider: Dr. Reino Bellis  Encounter Date: 09/05/2016      PT End of Session - 09/05/16 1156    Visit Number 2   Number of Visits 12   Date for PT Re-Evaluation 10/15/16   Authorization Type BCBS 30 visit limit   PT Start Time 1100   PT Stop Time 1145   PT Time Calculation (min) 45 min   Activity Tolerance Patient tolerated treatment well      Past Medical History:  Diagnosis Date  . Anxiety state, unspecified   . Contusion of buttock   . Hematoma 10/06/10   buttock   . Symptomatic menopausal or female climacteric states   . Thrombophlebitis leg 1990  . Varicose vein of leg   . Varicose veins     Past Surgical History:  Procedure Laterality Date  . CHOLECYSTECTOMY  December 1990   Dr.Matt Daphine Deutscher  . COLONOSCOPY  09/14/2008   Diverticulosis, Dr.Medoff   . ENDOMETRIAL ABLATION  2006   Dr.Ron Neal   . VARICOSE VEIN SURGERY Left 10/12/2013   [facility] Mourning [facility]    There were no vitals filed for this visit.      Subjective Assessment - 09/05/16 1104    Subjective Pt arrives already working out with trainer.  Is not doing any shoulder exercises. No complaints.  Only hurts at rest.  interested in DN   Currently in Pain? No/denies                         [facility] Adult PT Treatment/Exercise - 09/05/16 0001      Shoulder Exercises: Supine   Other Supine Exercises cane flexion, ER, abduction x 10 each 3sec holds     Shoulder Exercises: Standing   Other Standing Exercises cane abduction x 10, wall walking flexion, abduction     Shoulder Exercises: Pulleys   Flexion 2 minutes   ABduction 1 minute   ABduction Limitations education on pulleys for home     Manual Therapy    Manual Therapy Passive ROM;Joint mobilization   Joint Mobilization AP and inferior glides at various angles of abduction and flexion   Passive ROM L shoulder PROM to tolerance with ER/IR at various levels of abduction.  PROm and prolonged holds.                  PT Education - 09/05/16 1155    Education provided Yes   Education Details wall walking, stretches during daily activities, Diagnosis   Person(s) Educated Patient   Methods Explanation   Comprehension Verbalized understanding             PT Long Term Goals - 09/03/16 1330      PT LONG TERM GOAL #1   Title independent with HEP   Time 6   Period Weeks   Status New   Target Date 10/15/16     PT LONG TERM GOAL #2   Title improve Lt shoulder flexion and abduction to at least 120 degrees actively for improved UE use   Time 6   Period Weeks   Status New   Target Date 10/15/16     PT LONG TERM GOAL #3   Title report 75% improvement in sleep for  improved tolerance and pain   Time 6   Period Weeks   Status New   Target Date 10/15/16     PT LONG TERM GOAL #4   Title demonstrate 5/5 shoulder strength (if pt has been able to perform strengthening) for improved function   Time 6   Period Weeks   Status New   Target Date 10/15/16     PT LONG TERM GOAL #5   Title return to regular gym exercise without modification for improved function and strength   Time 6   Period Weeks   Status New   Target Date 10/15/16               Plan - 09/05/16 1156    Clinical Impression Statement Tolerated all well.  limitations in ROM all directions with pain and significant GH joint stiffness into inferior and AP movements. Interested in needling   PT Frequency 2x / week   PT Duration 6 weeks   PT Treatment/Interventions ADLs/Self Care Home Management;Cryotherapy;Electrical Stimulation;Moist Heat;Ultrasound;Therapeutic exercise;Therapeutic activities;Functional mobility training;Patient/family education;Manual  techniques;Passive range of motion;Vasopneumatic Device;Dry needling   PT Next Visit Plan aggressive ROM, consider TDN is MD okay with proceeding, modalities PRN, to avoid strengthening per MD      Patient will benefit from skilled therapeutic intervention in order to improve the following deficits and impairments:  Impaired flexibility, Postural dysfunction, Decreased range of motion, Decreased strength, Pain, Impaired UE functional use, Increased muscle spasms, Increased fascial restricitons  Visit Diagnosis: Left shoulder pain, unspecified chronicity  Stiffness of left shoulder, not elsewhere classified     Problem List Patient Active Problem List   Diagnosis Date Noted  . Right foot pain 03/13/2016  . Dyslipidemia 01/03/2015  . Palpitations 01/03/2015  . Varicose veins of leg with complications 10/18/2013  . Symptomatic menopausal or female climacteric states 12/22/2012  . Anxiety state     Idamae Lusher, DPT, CMP 09/05/2016, 12:01 PM  [facility] [address] [place], [place], [address] Phone: [phone]   Fax:  [phone]  Name: Judith Oconnor MRN: [id number] Date of Birth: [birth year]

## 2016-09-09 ENCOUNTER — Encounter: Payer: Self-pay | Admitting: Physical Therapy

## 2016-09-09 ENCOUNTER — Ambulatory Visit: Payer: BLUE CROSS/BLUE SHIELD | Admitting: Physical Therapy

## 2016-09-09 DIAGNOSIS — M25612 Stiffness of left shoulder, not elsewhere classified: Secondary | ICD-10-CM

## 2016-09-09 DIAGNOSIS — M25512 Pain in left shoulder: Secondary | ICD-10-CM | POA: Diagnosis not present

## 2016-09-09 DIAGNOSIS — R293 Abnormal posture: Secondary | ICD-10-CM

## 2016-09-09 NOTE — Patient Instructions (Addendum)
   AAROM / ABD of the shoulder (pulley)  Seated position.  With arm out the the side, gently move 1 arm in a downward motion, lifting opposite arm up. 3 minutes  Trigger Point Dry Needling  . What is Trigger Point Dry Needling (DN)? o DN is a physical therapy technique used to treat muscle pain and dysfunction. Specifically, DN helps deactivate muscle trigger points (muscle knots).  o A thin filiform needle is used to penetrate the skin and stimulate the underlying trigger point. The goal is for a local twitch response (LTR) to occur and for the trigger point to relax. No medication of any kind is injected during the procedure.   . What Does Trigger Point Dry Needling Feel Like?  o The procedure feels different for each individual patient. Some patients report that they do not actually feel the needle enter the skin and overall the process is not painful. Very mild bleeding may occur. However, many patients feel a deep cramping in the muscle in which the needle was inserted. This is the local twitch response.   Marland Kitchen How Will I feel after the treatment? o Soreness is normal, and the onset of soreness may not occur for a few hours. Typically this soreness does not last longer than two days.  o Bruising is uncommon, however; ice can be used to decrease any possible bruising.  o In rare cases feeling tired or nauseous after the treatment is normal. In addition, your symptoms may get worse before they get better, this period will typically not last longer than 24 hours.   . What Can I do After My Treatment? o Increase your hydration by drinking more water for the next 24 hours. o You may place ice or heat on the areas treated that have become sore, however, do not use heat on inflamed or bruised areas. Heat often brings more relief post needling. o You can continue your regular activities, but vigorous activity is not recommended initially after the treatment for 24 hours. o DN is best combined with  other physical therapy such as strengthening, stretching, and other therapies.    [facility] [address] [place], [place] [address] Phone # [phone] Fax [phone]

## 2016-09-09 NOTE — Therapy (Signed)
[facility] [address] [place], [place], [address] Phone: [phone]   Fax:  [phone]  Physical Therapy Treatment  Patient Details  Name: Judith Oconnor MRN: [id number] Date of Birth: 1955/04/24 Referring Provider: Dr. Reino Bellis  Encounter Date: 09/09/2016      PT End of Session - 09/09/16 1617    Visit Number 3   Number of Visits 12   Date for PT Re-Evaluation 10/15/16   Authorization Type BCBS 30 visit limit   PT Start Time 1617   PT Stop Time 1703   PT Time Calculation (min) 46 min   Activity Tolerance Patient tolerated treatment well   Behavior During Therapy [facility] for tasks assessed/performed      Past Medical History:  Diagnosis Date  . Anxiety state, unspecified   . Contusion of buttock   . Hematoma 10/06/10   buttock   . Symptomatic menopausal or female climacteric states   . Thrombophlebitis leg 1990  . Varicose vein of leg   . Varicose veins     Past Surgical History:  Procedure Laterality Date  . CHOLECYSTECTOMY  December 1990   Dr.Matt Daphine Deutscher  . COLONOSCOPY  09/14/2008   Diverticulosis, Dr.Medoff   . ENDOMETRIAL ABLATION  2006   Dr.Ron Neal   . VARICOSE VEIN SURGERY Left 10/12/2013   Cyril Mourning [facility]    There were no vitals filed for this visit.      Subjective Assessment - 09/09/16 1628    Subjective Pt states she has not gotten an x-ray yet.  Denies pain at rest.   Pertinent History anxiety   Limitations House hold activities;Lifting   Diagnostic tests U/S Lt shoulder: normal   Patient Stated Goals improve ROM, decrease pain, improve sleep; get back to regular exercise   Currently in Pain? No/denies                         [facility] Adult PT Treatment/Exercise - 09/09/16 0001      Shoulder Exercises: Pulleys   Flexion 3 minutes   ABduction 3 minutes   Other Pulley Exercises IR 3 minutes     Manual Therapy   Manual Therapy Soft tissue  mobilization   Joint Mobilization AP and inferior glides at various angles of abduction and flexion   Soft tissue mobilization pecs, deltoid, RTC, upper trap (all Lt side)   Passive ROM L shoulder PROM to tolerance with ER/IR at various levels of abduction.  PROm and prolonged holds.            Trigger Point Dry Needling - 09/09/16 1708    Consent Given? Yes   Education Handout Provided Yes   Muscles Treated Upper Body Supraspinatus;Infraspinatus;Upper trapezius  anterior deltoid   Upper Trapezius Response Twitch reponse elicited;Palpable increased muscle length   Supraspinatus Response Palpable increased muscle length;Twitch response elicited   Infraspinatus Response Palpable increased muscle length;Twitch response elicited              PT Education - 09/09/16 1706    Education provided Yes   Education Details pulleys AAROM, dry needling aftercare   Person(s) Educated Patient   Methods Explanation;Demonstration;Handout   Comprehension Verbalized understanding;Returned demonstration             PT Long Term Goals - 09/09/16 1707      PT LONG TERM GOAL #1   Title independent with HEP   Time 6   Period Weeks  Status On-going     PT LONG TERM GOAL #2   Title improve Lt shoulder flexion and abduction to at least 120 degrees actively for improved UE use   Time 6   Period Weeks   Status On-going     PT LONG TERM GOAL #3   Title report 75% improvement in sleep for improved tolerance and pain   Time 6   Period Weeks   Status On-going     PT LONG TERM GOAL #4   Title demonstrate 5/5 shoulder strength (if pt has been able to perform strengthening) for improved function   Time 6   Period Weeks   Status On-going     PT LONG TERM GOAL #5   Title return to regular gym exercise without modification for improved function and strength   Time 6   Period Weeks   Status On-going               Plan - 09/09/16 1711    Clinical Impression Statement Patietn  tolerated treatment well.  Left shoulder had reduced hypomobility after performing joint mobs.  Still remains very stiff.  Pt had some trigger point release with manual and needling and stated she was sore after treatment.  Will follow up on outcomes at next visit.   PT Treatment/Interventions ADLs/Self Care Home Management;Cryotherapy;Electrical Stimulation;Moist Heat;Ultrasound;Therapeutic exercise;Therapeutic activities;Functional mobility training;Patient/family education;Manual techniques;Passive range of motion;Vasopneumatic Device;Dry needling   PT Next Visit Plan f/u with DN, aggressive ROM, , modalities PRN, to avoid strengthening per MD   Consulted and Agree with Plan of Care Patient      Patient will benefit from skilled therapeutic intervention in order to improve the following deficits and impairments:  Impaired flexibility, Postural dysfunction, Decreased range of motion, Decreased strength, Pain, Impaired UE functional use, Increased muscle spasms, Increased fascial restricitons  Visit Diagnosis: Left shoulder pain, unspecified chronicity  Stiffness of left shoulder, not elsewhere classified  Abnormal posture     Problem List Patient Active Problem List   Diagnosis Date Noted  . Right foot pain 03/13/2016  . Dyslipidemia 01/03/2015  . Palpitations 01/03/2015  . Varicose veins of leg with complications 10/18/2013  . Symptomatic menopausal or female climacteric states 12/22/2012  . Anxiety state     Vincente Poli, PT 09/09/2016, 5:22 PM  [facility] [address], [address] [place], [place], [address] Phone: [phone]   Fax:  [phone]  Name: Judith Oconnor MRN: [id number] Date of Birth: 1955-01-24

## 2016-09-12 ENCOUNTER — Encounter: Payer: Self-pay | Admitting: Rehabilitation

## 2016-09-12 ENCOUNTER — Ambulatory Visit: Payer: BLUE CROSS/BLUE SHIELD | Admitting: Rehabilitation

## 2016-09-12 DIAGNOSIS — M25512 Pain in left shoulder: Secondary | ICD-10-CM | POA: Diagnosis not present

## 2016-09-12 DIAGNOSIS — M25612 Stiffness of left shoulder, not elsewhere classified: Secondary | ICD-10-CM

## 2016-09-12 DIAGNOSIS — R293 Abnormal posture: Secondary | ICD-10-CM

## 2016-09-12 NOTE — Therapy (Signed)
[facility] [address] [place], [place], [address] Phone: [phone]   Fax:  [phone]  Physical Therapy Treatment  Patient Details  Name: Judith Oconnor MRN: [id number] Date of Birth: July 23, 1955 Referring Provider: Dr. Reino Bellis  Encounter Date: 09/12/2016      PT End of Session - 09/12/16 1007    Visit Number 4   Number of Visits 12   Date for PT Re-Evaluation 10/15/16   Authorization Type BCBS 30 visit limit   PT Start Time 0935   PT Stop Time 1010   PT Time Calculation (min) 35 min   Activity Tolerance Patient tolerated treatment well      Past Medical History:  Diagnosis Date  . Anxiety state, unspecified   . Contusion of buttock   . Hematoma 10/06/10   buttock   . Symptomatic menopausal or female climacteric states   . Thrombophlebitis leg 1990  . Varicose vein of leg   . Varicose veins     Past Surgical History:  Procedure Laterality Date  . CHOLECYSTECTOMY  December 1990   Dr.Matt Daphine Deutscher  . COLONOSCOPY  09/14/2008   Diverticulosis, Dr.Medoff   . ENDOMETRIAL ABLATION  2006   Dr.Ron Neal   . VARICOSE VEIN SURGERY Left 10/12/2013   [facility] Mourning [facility]    There were no vitals filed for this visit.      Subjective Assessment - 09/12/16 0936    Subjective Reports she could not tell a difference with the TDN on the last session.  Feels about the same overall.  Trying to get the xray today   Currently in Pain? No/denies                         [facility] Adult PT Treatment/Exercise - 09/12/16 0001      Shoulder Exercises: Supine   Other Supine Exercises Rebounder flexion, ER x 15 to tolerance   Other Supine Exercises segmental thoracic extension over foam roll and pect stretching supine on foam roall; on foam roll overhead flexion x 5 with green strap, alternating overhead flexion x 5bil     Shoulder Exercises: Pulleys   Flexion 3 minutes   ABduction 3 minutes     Shoulder Exercises: Stretch   Internal Rotation Stretch 30 seconds   Internal Rotation Stretch Limitations behind the back stretch      Manual Therapy   Manual Therapy Soft tissue mobilization   Joint Mobilization AP and inferior glides at various angles of abduction and flexion and with flexion and abduction motions   Passive ROM L shoulder PROM to tolerance with ER/IR at various levels of abduction.  PROm and prolonged holds.                       PT Long Term Goals - 09/12/16 1009      PT LONG TERM GOAL #1   Title independent with HEP   Status On-going     PT LONG TERM GOAL #2   Title improve Lt shoulder flexion and abduction to at least 120 degrees actively for improved UE use   Status On-going     PT LONG TERM GOAL #3   Title report 75% improvement in sleep for improved tolerance and pain   Status On-going     PT LONG TERM GOAL #4   Title demonstrate 5/5 shoulder strength (if pt has been able to perform strengthening) for improved function  Status On-going     PT LONG TERM GOAL #5   Title return to regular gym exercise without modification for improved function and strength   Status On-going               Plan - 09/12/16 1007    Clinical Impression Statement Willing to try dry needling again but overall did not feel any benefit.  Improved GH joint mobility AP today and improving ROM   PT Frequency 2x / week   PT Duration 6 weeks   PT Treatment/Interventions ADLs/Self Care Home Management;Cryotherapy;Electrical Stimulation;Moist Heat;Ultrasound;Therapeutic exercise;Therapeutic activities;Functional mobility training;Patient/family education;Manual techniques;Passive range of motion;Vasopneumatic Device;Dry needling   PT Next Visit Plan f/u with DN, aggressive ROM, , modalities PRN, to avoid strengthening per MD      Patient will benefit from skilled therapeutic intervention in order to improve the following deficits and impairments:  Impaired  flexibility, Postural dysfunction, Decreased range of motion, Decreased strength, Pain, Impaired UE functional use, Increased muscle spasms, Increased fascial restricitons  Visit Diagnosis: Left shoulder pain, unspecified chronicity  Stiffness of left shoulder, not elsewhere classified  Abnormal posture     Problem List Patient Active Problem List   Diagnosis Date Noted  . Right foot pain 03/13/2016  . Dyslipidemia 01/03/2015  . Palpitations 01/03/2015  . Varicose veins of leg with complications 10/18/2013  . Symptomatic menopausal or female climacteric states 12/22/2012  . Anxiety state     Idamae Lusherevis, Claudie Rathbone R, DPT, CMP 09/12/2016, 10:15 AM  [facility] [address] [place], [place], [address] Phone: [phone]   Fax:  [phone]  Name: Judith Oconnor MRN: [id number] Date of Birth: 03-15-1955

## 2016-09-17 ENCOUNTER — Ambulatory Visit: Payer: BLUE CROSS/BLUE SHIELD | Attending: Sports Medicine | Admitting: Physical Therapy

## 2016-09-17 DIAGNOSIS — R293 Abnormal posture: Secondary | ICD-10-CM | POA: Diagnosis present

## 2016-09-17 DIAGNOSIS — M25612 Stiffness of left shoulder, not elsewhere classified: Secondary | ICD-10-CM | POA: Insufficient documentation

## 2016-09-17 DIAGNOSIS — M25512 Pain in left shoulder: Secondary | ICD-10-CM | POA: Diagnosis not present

## 2016-09-17 NOTE — Therapy (Signed)
[facility] [address] [place], [place], [address] Phone: [phone]   Fax:  [phone]  Physical Therapy Treatment  Patient Details  Name: Judith Oconnor MRN: [id number] Date of Birth: 01-25-1955 Referring Provider: Dr. Reino Bellis  Encounter Date: 09/17/2016      PT End of Session - 09/17/16 1625    Visit Number 5   Number of Visits 12   Date for PT Re-Evaluation 10/15/16   Authorization Type BCBS 30 visit limit   PT Start Time 1620   PT Stop Time 1658   PT Time Calculation (min) 38 min   Activity Tolerance Patient tolerated treatment well;No increased pain   Behavior During Therapy [facility] for tasks assessed/performed      Past Medical History:  Diagnosis Date  . Anxiety state, unspecified   . Contusion of buttock   . Hematoma 10/06/10   buttock   . Symptomatic menopausal or female climacteric states   . Thrombophlebitis leg 1990  . Varicose vein of leg   . Varicose veins     Past Surgical History:  Procedure Laterality Date  . CHOLECYSTECTOMY  December 1990   Dr.Matt Daphine Deutscher  . COLONOSCOPY  09/14/2008   Diverticulosis, Dr.Medoff   . ENDOMETRIAL ABLATION  2006   Dr.Ron Neal   . VARICOSE VEIN SURGERY Left 10/12/2013   [facility] Mourning [facility]    There were no vitals filed for this visit.      Subjective Assessment - 09/17/16 1621    Subjective Pt reports things are going well. She was not able to complete her exercises over the weekend because she was out of town.    Currently in Pain? No/denies                         [facility] Adult PT Treatment/Exercise - 09/17/16 0001      Shoulder Exercises: Supine   External Rotation Left;20 reps   External Rotation Limitations with cane at 90 deg abduction    Flexion AAROM;Both;20 reps   Flexion Limitations with cane      Shoulder Exercises: Stretch   Internal Rotation Stretch Limitations 10x10 sec hold, LUE only using towel behind  the back    Table Stretch -Flexion Limitations x10 reps with UE on physioball   Table Stretch - ABduction Limitations x10 reps with UE on physioball   Other Shoulder Stretches LUE walkout on physioball in scapular plane x10 reps      Manual Therapy   Joint Mobilization Grade III-IV inferior glenohumeral glides, MWM posterior mobilization with AAROM into ER at 90 deg abd; Scapular upward rotation mobilizations                 PT Education - 09/17/16 1652    Education provided Yes   Education Details encouraged pt to increase HEP adherence with upcoming vacation   Person(s) Educated Patient   Methods Explanation   Comprehension Verbalized understanding             PT Long Term Goals - 09/12/16 1009      PT LONG TERM GOAL #1   Title independent with HEP   Status On-going     PT LONG TERM GOAL #2   Title improve Lt shoulder flexion and abduction to at least 120 degrees actively for improved UE use   Status On-going     PT LONG TERM GOAL #3   Title report 75% improvement in sleep for  improved tolerance and pain   Status On-going     PT LONG TERM GOAL #4   Title demonstrate 5/5 shoulder strength (if pt has been able to perform strengthening) for improved function   Status On-going     PT LONG TERM GOAL #5   Title return to regular gym exercise without modification for improved function and strength   Status On-going               Plan - 09/17/16 1654    Clinical Impression Statement Continued with manual techniques and therex to promote increase in Lt shoulder ROM. Introduced shoulder ER stretch in 90 deg of abduction, noting improvements in this even after completion of the exercise during her session. Pt is going out of town this weekend, so therapist encouraged her to pick back up with full HEP adherence in order to continue progressing towards her goals. She verbalized understanding of this. Pt reporting no increase in pain by the end of today's session.  Will continue with current POC to improve shoulder ROM.   PT Frequency 2x / week   PT Duration 6 weeks   PT Treatment/Interventions ADLs/Self Care Home Management;Cryotherapy;Electrical Stimulation;Moist Heat;Ultrasound;Therapeutic exercise;Therapeutic activities;Functional mobility training;Patient/family education;Manual techniques;Passive range of motion;Vasopneumatic Device;Dry needling   PT Next Visit Plan conitnue with aggressive ROM, to avoid strengthening per MD; incorporate more scapular strengthening and mobility work    Financial plannerConsulted and Agree with Plan of Care Patient      Patient will benefit from skilled therapeutic intervention in order to improve the following deficits and impairments:  Impaired flexibility, Postural dysfunction, Decreased range of motion, Decreased strength, Pain, Impaired UE functional use, Increased muscle spasms, Increased fascial restricitons  Visit Diagnosis: Left shoulder pain, unspecified chronicity  Stiffness of left shoulder, not elsewhere classified  Abnormal posture     Problem List Patient Active Problem List   Diagnosis Date Noted  . Right foot pain 03/13/2016  . Dyslipidemia 01/03/2015  . Palpitations 01/03/2015  . Varicose veins of leg with complications 10/18/2013  . Symptomatic menopausal or female climacteric states 12/22/2012  . Anxiety state     5:01 PM,09/17/16 Marylyn IshiharaSara Kiser PT, DPT [facility]  [phone]  [facility] [address] [place], [place], [address] Phone: [phone]   Fax:  [phone]  Name: Judith CatholicMary B Oconnor MRN: [id number] Date of Birth: [birth year]

## 2016-09-18 ENCOUNTER — Encounter: Payer: BLUE CROSS/BLUE SHIELD | Admitting: Physical Therapy

## 2016-09-24 ENCOUNTER — Ambulatory Visit
Admission: RE | Admit: 2016-09-24 | Discharge: 2016-09-24 | Disposition: A | Discharge status: Home / Self Care | Payer: BLUE CROSS/BLUE SHIELD | Source: Ambulatory Visit | Attending: Sports Medicine | Admitting: Sports Medicine

## 2016-09-24 DIAGNOSIS — M7502 Adhesive capsulitis of left shoulder: Secondary | ICD-10-CM

## 2016-09-25 ENCOUNTER — Ambulatory Visit: Payer: BLUE CROSS/BLUE SHIELD | Admitting: Physical Therapy

## 2016-09-25 DIAGNOSIS — M25612 Stiffness of left shoulder, not elsewhere classified: Secondary | ICD-10-CM

## 2016-09-25 DIAGNOSIS — R293 Abnormal posture: Secondary | ICD-10-CM

## 2016-09-25 DIAGNOSIS — M25512 Pain in left shoulder: Secondary | ICD-10-CM

## 2016-09-25 NOTE — Therapy (Addendum)
[facility] [address], [place], [place], [address] Phone: [phone]   Fax:  [phone]  Physical Therapy Treatment  Patient Details  Name: LINEA CALLES MRN: [id number] Date of Birth: Feb 11, 1955 Referring Provider: Dr. Lilia Argue  Encounter Date: 09/25/2016      PT End of Session - 09/25/16 0938    Visit Number 6   Number of Visits 12   Date for PT Re-Evaluation 10/15/16   Authorization Type BCBS 30 visit limit   PT Start Time 0350   PT Stop Time 1015   PT Time Calculation (min) 41 min   Activity Tolerance Patient tolerated treatment well;No increased pain;Patient limited by pain   Behavior During Therapy [facility] for tasks assessed/performed      Past Medical History:  Diagnosis Date  . Anxiety state, unspecified   . Contusion of buttock   . Hematoma 10/06/10   buttock   . Symptomatic menopausal or female climacteric states   . Thrombophlebitis leg 1990  . Varicose vein of leg   . Varicose veins     Past Surgical History:  Procedure Laterality Date  . CHOLECYSTECTOMY  December 1990   Dr.Matt Hassell Done  . COLONOSCOPY  09/14/2008   Diverticulosis, Dr.Medoff   . ENDOMETRIAL ABLATION  2006   [place] SURGERY Left 10/12/2013   Junious Silk -[facility]    There were no vitals filed for this visit.      Subjective Assessment - 09/25/16 0941    Subjective Pt states she didn't do the exercises as much while at the beach last week.  Denies pain except with movement   Limitations House hold activities;Lifting   Diagnostic tests U/S and x-ray Lt shoulder: normal   Currently in Pain? No/denies            [facility] PT Assessment - 09/25/16 0001      AROM   Left Shoulder Flexion 126 Degrees   Left Shoulder ABduction 109 Degrees                     [facility] Adult PT Treatment/Exercise - 09/25/16 0001      Shoulder Exercises: Supine   External Rotation Left;AAROM   with cane   External Rotation Limitations with cane at 90 deg abduction    Flexion AAROM;Left  with cane x 2 min   Flexion Limitations with cane    ABduction Left;AAROM  with cane x 2 min     Shoulder Exercises: Pulleys   Flexion 3 minutes   Other Pulley Exercises IR 3 minutes     Manual Therapy   Joint Mobilization Grade III-IV inferior glenohumeral glides, MWM posterior mobilization with AAROM into ER at 90 deg abd; Scapular upward rotation mobilizations    Passive ROM L shoulder PROM to tolerance with ER/IR at various levels of abduction.  PROm and prolonged holds.                       PT Long Term Goals - 09/25/16 1014      PT LONG TERM GOAL #1   Title independent with HEP   Time 6   Period Weeks   Status Achieved     PT LONG TERM GOAL #2   Title improve Lt shoulder flexion and abduction to at least 120 degrees actively for improved UE use   Baseline aBd 109 from 90; flexion 126 from 105   Time  6   Period Weeks   Status Partially Met     PT LONG TERM GOAL #3   Title report 75% improvement in sleep for improved tolerance and pain   Time 6   Period Weeks   Status Achieved     PT LONG TERM GOAL #4   Title demonstrate 5/5 shoulder strength (if pt has been able to perform strengthening) for improved function   Baseline unable to test   Time 6   Period Weeks   Status Not Met     PT LONG TERM GOAL #5   Title return to regular gym exercise without modification for improved function and strength   Time 6   Period Weeks   Status Achieved               Plan - 09/25/16 1015    Clinical Impression Statement Patient is independent with HEP.  She has demonstrated improvements in ROM and is able to manage and porogress at home at this time.  She will most likely discharge today unless she hears otherwise at MD visit on Monday.   Rehab Potential Good   PT Treatment/Interventions ADLs/Self Care Home Management;Cryotherapy;Electrical Stimulation;Moist  Heat;Ultrasound;Therapeutic exercise;Therapeutic activities;Functional mobility training;Patient/family education;Manual techniques;Passive range of motion;Vasopneumatic Device;Dry needling   PT Next Visit Plan ROM if needing to return per MD rec   Consulted and Agree with Plan of Care Patient      Patient will benefit from skilled therapeutic intervention in order to improve the following deficits and impairments:  Impaired flexibility, Postural dysfunction, Decreased range of motion, Decreased strength, Pain, Impaired UE functional use, Increased muscle spasms, Increased fascial restricitons  Visit Diagnosis: Left shoulder pain, unspecified chronicity  Stiffness of left shoulder, not elsewhere classified  Abnormal posture     Problem List Patient Active Problem List   Diagnosis Date Noted  . Right foot pain 03/13/2016  . Dyslipidemia 01/03/2015  . Palpitations 01/03/2015  . Varicose veins of leg with complications 36/06/7701  . Symptomatic menopausal or female climacteric states 12/22/2012  . Anxiety state     Zannie Cove, PT 09/25/2016, 10:18 AM  [facility] [address] [place], [place], [address] Phone: [phone]   Fax:  [phone]  Name: DHANVI BOESEN MRN: [id number] Date of Birth: 10/22/1955  PHYSICAL THERAPY DISCHARGE SUMMARY  Visits from Start of Care: 6  Current functional level related to goals / functional outcomes: See above for most recent update   Remaining deficits: See above   Education / Equipment: HEP  Plan: Patient agrees to discharge.  Patient goals were not met. Patient is being discharged due to not returning since the last visit.  ?????         Google, PT 12/25/16 9:09 AM

## 2016-09-29 ENCOUNTER — Ambulatory Visit (INDEPENDENT_AMBULATORY_CARE_PROVIDER_SITE_OTHER): Payer: BLUE CROSS/BLUE SHIELD | Admitting: Sports Medicine

## 2016-09-29 VITALS — BP 138/86 | Ht 67.0 in | Wt 160.0 lb

## 2016-09-29 DIAGNOSIS — M7502 Adhesive capsulitis of left shoulder: Secondary | ICD-10-CM

## 2016-09-29 NOTE — Progress Notes (Signed)
Subjective:    Patient ID: Judith Oconnor, female    DOB: [birth year], 61 y.o.   MRN: [id number]  61 year old female who presents for reevaluation of left shoulder. She has been undergoing physical therapy for suspected adhesive capsulitis of the left shoulder. She has had a negative x-ray of the left shoulder as well as an ultrasound which did not show any significant abnormalities. There was no initial inciting injury that she can remember. Today, she reports she is approximately 50% improved. She feels her range of motion still has its limitations and she still has pain with internal rotation and abduction of the shoulder. She does state however that she has been doing home exercises and has been working with her trainer twice weekly and is improving. She denies any significant numbness or tingling. She denies any loss of grip strength of her left arm. She states that she tried taking the amitriptyline however it made her extremely drowsy in the morning so she stopped this. She also has stopped taking meloxicam as she is not in any significant pain unless she is lifting against resistance above her head. Overall, she is pleased with the progress she is making however she no she is not quite back to normal yet.      Review of Systems  Musculoskeletal: Positive for arthralgias (Right shoulder restricted motion, right shoulder pain diffuse). Negative for back pain, joint swelling, myalgias, neck pain and neck stiffness.  Skin: Negative for color change and rash.  Neurological: Negative for weakness and numbness.       Objective:   Physical Exam  Constitutional: She appears well-developed and well-nourished. No distress.  Musculoskeletal:  No obvious abnormality upon inspection. Mild tenderness to palpation in the sternocleidomastoid and upper trapezius muscle belly. Active range of motion is: Forward flexion of the shoulder to 160 on the left, 180 on the right, abduction of the right shoulder  180, left shoulder 130, external rotation 60 on the right, 50 on the left, internal rotation T5 on the right, T12 on the left, muscle strength testing: Shoulder flexion +5 out of 5 bilaterally, external rotation 5 out of 5, internal rotation 5 out of 5, abduction 5 out of 5, sensation is intact to light touch C5-T1, radial pulse +2 out of 4 bilaterally, continued pain with Hawkins test, negative cross arm test, negative empty can test, negative speed's and Yergason's testing  Neurological: She displays normal reflexes. She exhibits normal muscle tone.  Skin: Skin is warm. No rash noted. No erythema.          Assessment & Plan:  Adhesive capsulitis of left shoulder  Given the patient's history, clinical exam, and prior diagnostic findings, I do believe that she is suffering from adhesive capsulitis as stated in the prior note. She is improving in terms of range of motion and pain. At this point, I do believe we can progress her activity to home exercises from formal physical therapy. She was taught formal physical therapy exercises and is in understanding of what she needs to do to continue to gain range of motion. She is not to perform any lifting exercises above her head with her trainer. She may perform abduction to 90 as well as curls. I did review her left shoulder x-ray which was negative for acute fracture or dislocation. Also reviewed prior notes of the ultrasound which did not show any significant abnormalities. At this point, I would like to reevaluate her in 6 weeks. If she is continuing not  to improve, will consider doing further testing including MRI however the patient at this time is not interested in pursuing surgery at the moment given her improvement. She may stop the Elavil and meloxicam. She may take anti-inflammatories as tolerated. Follow-up in 6 weeks.

## 2016-11-10 ENCOUNTER — Ambulatory Visit: Payer: BLUE CROSS/BLUE SHIELD | Admitting: Sports Medicine

## 2016-12-08 ENCOUNTER — Ambulatory Visit: Payer: BLUE CROSS/BLUE SHIELD | Admitting: Sports Medicine

## 2017-01-23 ENCOUNTER — Encounter: Payer: BLUE CROSS/BLUE SHIELD | Admitting: Internal Medicine

## 2017-01-26 ENCOUNTER — Ambulatory Visit (INDEPENDENT_AMBULATORY_CARE_PROVIDER_SITE_OTHER): Payer: BLUE CROSS/BLUE SHIELD | Admitting: Internal Medicine

## 2017-01-26 ENCOUNTER — Encounter: Payer: Self-pay | Admitting: Internal Medicine

## 2017-01-26 VITALS — BP 120/70 | HR 77 | Temp 98.0°F | Wt 162.0 lb

## 2017-01-26 DIAGNOSIS — M7751 Other enthesopathy of right foot: Secondary | ICD-10-CM

## 2017-01-26 DIAGNOSIS — M7502 Adhesive capsulitis of left shoulder: Secondary | ICD-10-CM

## 2017-01-26 DIAGNOSIS — E785 Hyperlipidemia, unspecified: Secondary | ICD-10-CM

## 2017-01-26 DIAGNOSIS — Z0000 Encounter for general adult medical examination without abnormal findings: Secondary | ICD-10-CM

## 2017-01-26 NOTE — Patient Instructions (Addendum)
Increase cardio exercise and decrease red meat, fried foods, starchy foods and sweets.   Come next week for your fasting cholesterol, basic labs.

## 2017-01-26 NOTE — Progress Notes (Signed)
Provider:  Gwenith Spitz. Renato Gails, D.O., C.M.D. Location:   [facility]  Place of Service:   [facility]  Previous PCP: Kermit Balo, DO Patient Care Team: Kermit Balo, DO as PCP - General (Geriatric Medicine) Pryor Ochoa, MD as Consulting Physician (Vascular Surgery) Freddy Finner, MD as Consulting Physician (Obstetrics and Gynecology) Sharrell Ku, MD as Consulting Physician (Gastroenterology) Luretha Murphy, MD as Consulting Physician (General Surgery) Ernesto Rutherford, MD as Consulting Physician (Ophthalmology)  Extended Emergency Contact Information Primary Emergency Contact: [place] Home Phone: [phone] Mobile Phone: [phone] Relation: Significant other Secondary Emergency Contact: Joline Salt, [place] Home Phone: [phone] Relation: Father  Code Status: Full code Goals of Care: Advanced Directive information--she thinks she has done her living will and HCPOA in the past, but we don't have a copy Advanced Directives 01/26/2017  Does Patient Have a Medical Advance Directive? No  Type of Advance Directive -  Copy of Healthcare Power of Attorney in Chart? -  Would patient like information on creating a medical advance directive? No - Patient declined   Chief Complaint  Patient presents with  . Annual Exam    CPE    HPI: Patient is a 62 y.o. female seen today for an annual physical exam.  Sees Dr. Jennette Kettle for her gynecologic examinations.  She is not on any medications whatsoever now--no hormones anymore.  Dr. Jennette Kettle did her bone density a year ago.  She reports this was normal.    She had been seeing Dr. 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    She had some issues with menopausal symptoms.  Had been on hormones.    Hyperlipidemia: LDL has run high.    She exercises with a trainer twice a week.  She has a walking group--walks 45 mins the rest of the week.  Eats  chicken, salads.  Eats red meat, but not much.  Eats healthily the majority of the time.  Had dropped a few lbs before Christmas and has gained a few back.  She had her flu shot at [facility] in [place].  She is interested in getting her shingles shot.    Past Medical History:  Diagnosis Date  . Anxiety state, unspecified   . Contusion of buttock   . Hematoma 10/06/10   buttock   . Symptomatic menopausal or female climacteric states   . Thrombophlebitis leg 1990  . Varicose vein of leg   . Varicose veins    Past Surgical History:  Procedure Laterality Date  . CHOLECYSTECTOMY  December 1990   Dr.Matt Daphine Deutscher  . COLONOSCOPY  09/14/2008   Diverticulosis, Dr.Medoff   . ENDOMETRIAL ABLATION  2006   Dr.Ron Neal   . VARICOSE VEIN SURGERY Left 10/12/2013   [facility]    reports that  has never smoked. she has never used smokeless tobacco. She reports that she drinks alcohol. She reports that she does not use drugs.  Functional Status Survey:  completely independent  Family History  Problem Relation Age of Onset  . Macular degeneration Mother   . Coronary artery disease Father   . Breast cancer Neg Hx     Health Maintenance  Topic Date Due  . Hepatitis C Screening  11/[birth year]/1956  . HIV Screening  09/23/1971  . PAP SMEAR  05/01/2017  . MAMMOGRAM  05/09/2018  . COLONOSCOPY  09/15/2018  . TETANUS/TDAP  11/24/2021  . INFLUENZA VACCINE  Completed    No Known Allergies  Outpatient Encounter Medications as of 01/26/2017  Medication Sig  . [DISCONTINUED] amitriptyline (ELAVIL) 25 MG tablet Take 1 tablet (25 mg total) by mouth at bedtime. (Patient not taking: Reported on 09/03/2016)  . [DISCONTINUED] amoxicillin-clavulanate (AUGMENTIN) 875-125 MG tablet One twice daily for infection (Patient not taking: Reported on 09/03/2016)  . [DISCONTINUED] gentamicin (GARAMYCIN) 0.3 % ophthalmic solution INT 1 TO 2 GTS LEY Q 4 TO 6 H FOR 5 DAYS  . [DISCONTINUED] ibuprofen  (ADVIL,MOTRIN) 200 MG tablet Take 200 mg by mouth every 8 (eight) hours as needed.  . [DISCONTINUED] meloxicam (MOBIC) 15 MG tablet Take one a day for 7 days then as needed (Patient not taking: Reported on 09/29/2016)   No facility-administered encounter medications on file as of 01/26/2017.     Review of Systems  Constitutional: Negative for chills, fever and malaise/fatigue.  HENT: Negative for congestion and hearing loss.   Eyes: Negative for blurred vision.       Glasses, saw Dr. Dione BoozeGroat last year  Respiratory: Negative for cough and shortness of breath.   Cardiovascular: Negative for chest pain, palpitations and leg swelling.  Gastrointestinal: Negative for abdominal pain, blood in stool, constipation, diarrhea, heartburn, melena, nausea and vomiting.  Genitourinary: Negative for dysuria, frequency and urgency.  Musculoskeletal: Negative for falls.       Right heel spur  Skin: Negative for itching and rash.       Small patch of skin near jawline anterior to left ear (where hoop earrings touch)  Neurological: Negative for dizziness, loss of consciousness and weakness.  Endo/Heme/Allergies: Does not bruise/bleed easily.  Psychiatric/Behavioral: Negative for depression and memory loss. The patient is not nervous/anxious.     Vitals:   01/26/17 0908  BP: 120/70  Pulse: 77  Temp: 98 F (36.7 C)  TempSrc: Oral  SpO2: 97%  Weight: 162 lb (73.5 kg)   Body mass index is 25.37 kg/m. Physical Exam  Constitutional: She is oriented to person, place, and time. She appears well-developed and well-nourished. No distress.  HENT:  Head: Normocephalic and atraumatic.  Right Ear: External ear normal.  Left Ear: External ear normal.  Nose: Nose normal.  Mouth/Throat: Oropharynx is clear and moist. No oropharyngeal exudate.  Eyes: Conjunctivae and EOM are normal. Pupils are equal, round, and reactive to light.  Neck: Normal range of motion. Neck supple. No JVD present.  Cardiovascular:  Normal rate, regular rhythm, normal heart sounds and intact distal pulses.  No murmur heard. Pulmonary/Chest: Effort normal and breath sounds normal. No respiratory distress.  Abdominal: Soft. Bowel sounds are normal. She exhibits no distension. There is no tenderness.  Musculoskeletal: Normal range of motion. She exhibits no tenderness.  Very minor limitation of left shoulder on internal rotation  Lymphadenopathy:    She has no cervical adenopathy.  Neurological: She is alert and oriented to person, place, and time. No cranial nerve deficit or sensory deficit. She exhibits normal muscle tone.  1+ DTRs  Skin: Skin is warm and dry. Capillary refill takes less than 2 seconds.  Psychiatric: She has a normal mood and affect. Her behavior is normal. Judgment and thought content normal.    Labs reviewed: Lab Results  Component Value Date   TSH 1.430 12/23/2013   Imaging and Procedures Recently: Sees Dr. Stephens ShireNeal--will get records  Assessment/Plan 1. Annual physical exam - performed today (gets gyn exam with Dr. Loraine MapleNeal--last pap will be at 4465) - CBC  with Differential/Platelet; Future - COMPLETE METABOLIC PANEL WITH GFR; Future - Lipid panel; Future  2. Dyslipidemia -pt will come next week after the 19th for her fasting labs (based on insurance) - CBC with Differential/Platelet; Future - COMPLETE METABOLIC PANEL WITH GFR; Future - Lipid panel; Future  3. Adhesive capsulitis of left shoulder -improved, almost to baseline after PT and then personal training  4. Bone spur of right foot -ongoing, wears shoes to accomodate  Labs/tests ordered:   Orders Placed This Encounter  Procedures  . CBC with Differential/Platelet    Standing Status:   Future    Standing Expiration Date:   01/26/2018  . COMPLETE METABOLIC PANEL WITH GFR    Standing Status:   Future    Standing Expiration Date:   01/26/2018  . Lipid panel    Standing Status:   Future    Standing Expiration Date:   01/26/2018    Jayci Ellefson L. Blade Scheff, D.O. Geriatrics [facility] [address], [place] [address] Cell Phone (Mon-Fri 8am-5pm):  [phone] On Call:  [phone] & follow prompts after 5pm & weekends Office Phone:  [phone] Office Fax:  [phone]

## 2017-02-04 ENCOUNTER — Other Ambulatory Visit: Payer: No Typology Code available for payment source

## 2017-02-04 DIAGNOSIS — E785 Hyperlipidemia, unspecified: Secondary | ICD-10-CM

## 2017-02-04 DIAGNOSIS — Z0000 Encounter for general adult medical examination without abnormal findings: Secondary | ICD-10-CM

## 2017-02-04 LAB — CBC WITH DIFFERENTIAL/PLATELET
Basophils Absolute: 68 cells/uL (ref 0–200)
Basophils Relative: 1.3 %
Eosinophils Absolute: 260 cells/uL (ref 15–500)
Eosinophils Relative: 5 %
HCT: 37.9 % (ref 35.0–45.0)
Hemoglobin: 13.2 g/dL (ref 11.7–15.5)
Lymphs Abs: 1628 cells/uL (ref 850–3900)
MCH: 30.1 pg (ref 27.0–33.0)
MCHC: 34.8 g/dL (ref 32.0–36.0)
MCV: 86.3 fL (ref 80.0–100.0)
MPV: 9.9 fL (ref 7.5–12.5)
Monocytes Relative: 6.9 %
Neutro Abs: 2886 cells/uL (ref 1500–7800)
Neutrophils Relative %: 55.5 %
Platelets: 269 10*3/uL (ref 140–400)
RBC: 4.39 10*6/uL (ref 3.80–5.10)
RDW: 12.6 % (ref 11.0–15.0)
Total Lymphocyte: 31.3 %
WBC mixed population: 359 cells/uL (ref 200–950)
WBC: 5.2 10*3/uL (ref 3.8–10.8)

## 2017-02-04 LAB — LIPID PANEL
Cholesterol: 215 mg/dL — ABNORMAL HIGH (ref <200)
HDL: 51 mg/dL (ref >50)
LDL Cholesterol (Calc): 133 mg/dL (calc) — ABNORMAL HIGH
Non-HDL Cholesterol (Calc): 164 mg/dL (calc) — ABNORMAL HIGH (ref <130)
Total CHOL/HDL Ratio: 4.2 (calc) (ref <5.0)
Triglycerides: 172 mg/dL — ABNORMAL HIGH (ref <150)

## 2017-02-04 LAB — COMPLETE METABOLIC PANEL WITH GFR
AG Ratio: 2 (calc) (ref 1.0–2.5)
ALT: 19 U/L (ref 6–29)
AST: 16 U/L (ref 10–35)
Albumin: 4.4 g/dL (ref 3.6–5.1)
Alkaline phosphatase (APISO): 62 U/L (ref 33–130)
BUN: 16 mg/dL (ref 7–25)
CO2: 28 mmol/L (ref 20–32)
Calcium: 9.8 mg/dL (ref 8.6–10.4)
Chloride: 106 mmol/L (ref 98–110)
Creat: 0.7 mg/dL (ref 0.50–0.99)
GFR, Est African American: 109 mL/min/{1.73_m2} (ref >=60)
GFR, Est Non African American: 94 mL/min/{1.73_m2} (ref >=60)
Globulin: 2.2 g/dL (calc) (ref 1.9–3.7)
Glucose, Bld: 100 mg/dL — ABNORMAL HIGH (ref 65–99)
Potassium: 4.5 mmol/L (ref 3.5–5.3)
Sodium: 140 mmol/L (ref 135–146)
Total Bilirubin: 0.5 mg/dL (ref 0.2–1.2)
Total Protein: 6.6 g/dL (ref 6.1–8.1)

## 2017-02-06 ENCOUNTER — Encounter: Payer: Self-pay | Admitting: *Deleted

## 2017-12-21 DIAGNOSIS — Z029 Encounter for administrative examinations, unspecified: Secondary | ICD-10-CM

## 2018-02-01 ENCOUNTER — Encounter: Payer: Self-pay | Admitting: Internal Medicine

## 2018-02-01 ENCOUNTER — Ambulatory Visit: Payer: PRIVATE HEALTH INSURANCE | Admitting: Internal Medicine

## 2018-02-01 VITALS — BP 120/80 | HR 77 | Temp 98.3°F | Ht 67.0 in | Wt 158.0 lb

## 2018-02-01 DIAGNOSIS — E785 Hyperlipidemia, unspecified: Secondary | ICD-10-CM | POA: Diagnosis not present

## 2018-02-01 DIAGNOSIS — M778 Other enthesopathies, not elsewhere classified: Secondary | ICD-10-CM

## 2018-02-01 DIAGNOSIS — Z0000 Encounter for general adult medical examination without abnormal findings: Secondary | ICD-10-CM

## 2018-02-01 NOTE — Patient Instructions (Signed)
I recommend you begin calcium with vitamin D 66m/400units twice a day with meals.  Watch out for constipation.  Increasing your fiber and hydration will help this along with maintaining an exercise routine.  Preventive Care 40-64 Years, Female Preventive care refers to lifestyle choices and visits with your health care provider that can promote health and wellness. What does preventive care include?   A yearly physical exam. This is also called an annual well check.  Dental exams once or twice a year.  Routine eye exams. Ask your health care provider how often you should have your eyes checked.  Personal lifestyle choices, including: ? Daily care of your teeth and gums. ? Regular physical activity. ? Eating a healthy diet. ? Avoiding tobacco and drug use. ? Limiting alcohol use. ? Practicing safe sex. ? Taking low-dose aspirin daily starting at age 478040 ? Taking vitamin and mineral supplements as recommended by your health care provider. What happens during an annual well check? The services and screenings done by your health care provider during your annual well check will depend on your age, overall health, lifestyle risk factors, and family history of disease. Counseling Your health care provider may ask you questions about your:  Alcohol use.  Tobacco use.  Drug use.  Emotional well-being.  Home and relationship well-being.  Sexual activity.  Eating habits.  Work and work eStatistician  Method of birth control.  Menstrual cycle.  Pregnancy history. Screening You may have the following tests or measurements:  Height, weight, and BMI.  Blood pressure.  Lipid and cholesterol levels. These may be checked every 5 years, or more frequently if you are over 563years old.  Skin check.  Lung cancer screening. You may have this screening every year starting at age [age]if you have a 30-pack-year history of smoking and currently smoke or have quit within the past 15  years.  Colorectal cancer screening. All adults should have this screening starting at age 478038and continuing until age 62 Your health care provider may recommend screening at age 62 You will have tests every 1-10 years, depending on your results and the type of screening test. People at increased risk should start screening at an earlier age. Screening tests may include: ? Guaiac-based fecal occult blood testing. ? Fecal immunochemical test (FIT). ? Stool DNA test. ? Virtual colonoscopy. ? Sigmoidoscopy. During this test, a flexible tube with a tiny camera (sigmoidoscope) is used to examine your rectum and lower colon. The sigmoidoscope is inserted through your anus into your rectum and lower colon. ? Colonoscopy. During this test, a long, thin, flexible tube with a tiny camera (colonoscope) is used to examine your entire colon and rectum.  Hepatitis C blood test.  Hepatitis B blood test.  Sexually transmitted disease (STD) testing.  Diabetes screening. This is done by checking your blood sugar (glucose) after you have not eaten for a while (fasting). You may have this done every 1-3 years.  Mammogram. This may be done every 1-2 years. Talk to your health care provider about when you should start having regular mammograms. This may depend on whether you have a family history of breast cancer.  BRCA-related cancer screening. This may be done if you have a family history of breast, ovarian, tubal, or peritoneal cancers.  Pelvic exam and Pap test. This may be done every 3 years starting at age 820 Starting at age 62 this may be done every 5 years if you have a Pap test in  combination with an HPV test.  Bone density scan. This is done to screen for osteoporosis. You may have this scan if you are at high risk for osteoporosis. Discuss your test results, treatment options, and if necessary, the need for more tests with your health care provider. Vaccines Your health care provider may  recommend certain vaccines, such as:  Influenza vaccine. This is recommended every year.  Tetanus, diphtheria, and acellular pertussis (Tdap, Td) vaccine. You may need a Td booster every 10 years.  Varicella vaccine. You may need this if you have not been vaccinated.  Zoster vaccine. You may need this after age 18.  Measles, mumps, and rubella (MMR) vaccine. You may need at least one dose of MMR if you were born in 1957 or later. You may also need a second dose.  Pneumococcal 13-valent conjugate (PCV13) vaccine. You may need this if you have certain conditions and were not previously vaccinated.  Pneumococcal polysaccharide (PPSV23) vaccine. You may need one or two doses if you smoke cigarettes or if you have certain conditions.  Meningococcal vaccine. You may need this if you have certain conditions.  Hepatitis A vaccine. You may need this if you have certain conditions or if you travel or work in places where you may be exposed to hepatitis A.  Hepatitis B vaccine. You may need this if you have certain conditions or if you travel or work in places where you may be exposed to hepatitis B.  Haemophilus influenzae type b (Hib) vaccine. You may need this if you have certain conditions. Talk to your health care provider about which screenings and vaccines you need and how often you need them. This information is not intended to replace advice given to you by your health care provider. Make sure you discuss any questions you have with your health care provider. Document Released: 01/26/2015 Document Revised: 02/19/2017 Document Reviewed: 10/31/2014 Elsevier Interactive Patient Education  2019 [employer] Inc[employer]  Triceps Tendinitis  Triceps tendinitis is inflammation of the triceps tendon, which is located behind the elbow. The triceps tendon is a strong cord of tissue that connects the triceps muscle, on the back of the upper arm, to a bone in the elbow (ulna). The triceps muscle helps to bend  and straighten the elbow. Triceps tendinitis can interfere with your ability to do both of these movements. This condition is usually caused by overuse of the triceps muscle or injury to the upper arm. In most cases, triceps tendinitis heals within 6 weeks. Triceps tendinitis may include a grade 1 or grade 2 strain of the tendon. A grade 1 strain is mild, and it involves a slight pull of the tendon without any stretching or noticeable tearing of the tendon. There is usually no loss of triceps muscle strength. A grade 2 strain is moderate, and it involves a small tear in the tendon. The tendon is stretched, and triceps strength is usually decreased. What are the causes? This condition may be caused by:  Overuse of the triceps muscle.  A direct, forceful hit or injury (trauma) to the triceps tendon. What increases the risk? This condition is more likely to develop in:  People who participate in sports or activities that require sudden tightening (contraction) of the triceps muscle, such as biking or motorcycle riding.  People who participate in sports that involve moving the arm against resistance, such as weight lifting or bodybuilding.  People who have poor strength and flexibility of the arm and shoulder.  People who use  steroids. What are the signs or symptoms? Symptoms of this condition may include:  Pain and inflammation in the back of the elbow and the back of the upper arm. Pain may get worse when you try to straighten the elbow.  Bruising (contusion) in the back of the elbow and the back of the upper arm. This may develop 24-48 hours after trauma, if this applies.  Decreased triceps muscle strength, especially when straightening the elbow or gripping objects.  A crackling sound (crepitation) when you move or touch the elbow or the upper arm. In some cases, symptoms may return (recur) after treatment, and they may be long-lasting (chronic). How is this diagnosed? This condition is  diagnosed based on your symptoms, your medical history, and a physical exam. You may have tests, including X-rays or MRIs. Your health care provider may test your range of motion by asking you to do arm movements. How is this treated? This condition is treated by resting and icing the injured area, and by doing physical therapy exercises. Depending on the severity of your condition, treatment may also include:  Over-the-counter or prescription medicines that help to relieve pain and inflammation.  Keeping the elbow in place for a period of time (immobilization). This may be done by wearing a brace or a sling on your elbow. In rare cases, an elbow cast may be needed.  Surgery to repair (reconstruct) the triceps tendon. This is rare. Follow these instructions at home: If you have a brace or sling:  Wear it as told by your health care provider. Remove it only as told by your health care provider.  Loosen the brace or sling if your fingers tingle, become numb, or turn cold and blue.  Do not let your brace or sling get wet if it is not waterproof.  Keep the brace or sling clean. If you have a cast:  Do not stick anything inside the cast to scratch your skin. Doing that increases your risk of infection.  Check the skin around the cast every day. Report any concerns to your health care provider.  You may put lotion on dry skin around the edges of the cast. Do not apply lotion to the skin underneath the cast.  Do not let your cast get wet if it is not waterproof.  Keep the cast clean. Bathing  If you have a cast, brace, or sling, do not take baths, swim, or use a hot tub until your health care provider approves. Ask your health care provider if you can take showers. You may only be allowed to take sponge baths for bathing.  If you have a cast, brace, or sling that is not waterproof, cover it with a watertight covering when you take a bath or a shower. Managing pain, stiffness, and  swelling   If directed, apply ice to the injured area: ? Put ice in a plastic bag. ? Place a towel between your skin and the bag. ? Leave the ice on for 20 minutes, 2-3 times a day.  Move your fingers often to avoid stiffness and to lessen swelling.  Raise (elevate) the injured area above the level of your heart while you are sitting or lying down.  If directed, apply heat to the affected area before you exercise. Use the heat source that your health care provider recommends, such as a moist heat pack or a heating pad. ? Place a towel between your skin and the heat source. ? Leave the heat on for 20-30  minutes. ? Remove the heat if your skin turns bright red. This is especially important if you are unable to feel pain, heat, or cold. You may have a greater risk of getting burned. Driving  Do not drive or operate heavy machinery while taking prescription pain medicines.  Ask your health care provider when it is safe to drive if you have a cast, brace, or sling on your arm. Activity  Return to your normal activities as told by your health care provider. Ask your health care provider what activities are safe for you.  Do not lift anything that is heavier than 10 lb (4.5 kg) until your health care provider tells you that it is safe.  Avoid activities that cause pain or make your condition worse.  Do exercises as told by your health care provider. General instructions  If you have a cast, do not put pressure on any part of the cast until it is fully hardened. This may take several hours.  Take over-the-counter and prescription medicines only as told by your health care provider.  Keep all follow-up visits as told by your health care provider. This is important. How is this prevented?  Warm up and stretch before being active.  Cool down and stretch after being active.  Give your body time to rest between periods of activity.  Make sure to use equipment that fits you. Wear a  brace or tape your arm when playing contact sports, as told by your health care provider.  Be safe and responsible while being active to avoid falls.  Do at least 150 minutes of moderate-intensity exercise each week, such as brisk walking or water aerobics.  Maintain physical fitness, including: ? Strength. ? Flexibility. ? Cardiovascular fitness. ? Endurance. Contact a health care provider if:  You have symptoms that get worse or do not get better after 2 weeks of treatment.  You develop new symptoms. Get help right away if:  You develop severe pain.  You develop numbness or tingling in your hand.  Your hand feels unusually cold.  Your fingernails turn a dark color, such as blue or gray. This information is not intended to replace advice given to you by your health care provider. Make sure you discuss any questions you have with your health care provider. Document Released: 12/30/2004 Document Revised: 09/06/2015 Document Reviewed: 12/08/2014 Elsevier Interactive Patient Education  2019 [employer].

## 2018-02-01 NOTE — Progress Notes (Signed)
Provider:  Gwenith Spitz. Renato Gails, D.O., C.M.D. Location:   [facility]  Place of Service:   clinic  Previous PCP: Kermit Balo, DO Patient Care Team: Kermit Balo, DO as PCP - General (Geriatric Medicine) Pryor Ochoa, MD (Inactive) as Consulting Physician (Vascular Surgery) Freddy Finner, MD as Consulting Physician (Obstetrics and Gynecology) Sharrell Ku, MD as Consulting Physician (Gastroenterology) Luretha Murphy, MD as Consulting Physician (General Surgery) Ernesto Rutherford, MD as Consulting Physician (Ophthalmology)  Extended Emergency Contact Information Primary Emergency Contact: [place] Home Phone: [phone] Mobile Phone: [phone] Relation: Significant other Secondary Emergency Contact: Joline Salt, [place] Home Phone: [phone] Relation: Father  Goals of Care: Advanced Directive information Advanced Directives 01/26/2017  Does Patient Have a Medical Advance Directive? No  Type of Advance Directive -  Copy of Healthcare Power of Attorney in Chart? -  Would patient like information on creating a medical advance directive? No - Patient declined   Chief Complaint  Patient presents with  . Annual Exam    CPE    HPI: Patient is a 63 y.o. female seen today for an annual physical exam.  She is doing well and w/o concerns per CMA.    Nothing happening with her medically outside of a few aches and pains.  Not sure if due to working out like her right upper arm, left shoulder (resolved).  Had a tennis elbow when she used to play and now resting the right upper arm.    Her mom had a hip replacement surgery.  Pt feels like she needs a doctor rather than APP.  She also has a lung CT meant to be scheduled and they need help navigating the system.    Patient herself takes nothing at all.  She sees Dr. Jennette Kettle for her gyn and they order her mammogram.  Also due for cscope next year.    She has been  working to lose a little weight--had been near 150 lbs in Nov and then Dec hit.  She ate breakfast today.    Past Medical History:  Diagnosis Date  . Anxiety state, unspecified   . Contusion of buttock   . Hematoma 10/06/10   buttock   . Symptomatic menopausal or female climacteric states   . Thrombophlebitis leg 1990  . Varicose vein of leg   . Varicose veins    Past Surgical History:  Procedure Laterality Date  . CHOLECYSTECTOMY  December 1990   Dr.Matt Daphine Deutscher  . COLONOSCOPY  09/14/2008   Diverticulosis, Dr.Medoff   . ENDOMETRIAL ABLATION  2006   Dr.Ron Neal   . VARICOSE VEIN SURGERY Left 10/12/2013   [facility]    reports that she has never smoked. She has never used smokeless tobacco. She reports current alcohol use of about 6.0 - 8.0 standard drinks of alcohol per week. She reports that she does not use drugs.  Functional Status Survey:    Family History  Problem Relation Age of Onset  . Macular degeneration Mother   . Coronary artery disease Father   . Breast cancer Neg Hx     Health Maintenance  Topic Date Due  . Hepatitis C Screening  1955-04-26  . HIV Screening  09/23/1971  . PAP SMEAR-Modifier  05/01/2017  . MAMMOGRAM  05/09/2018  . DEXA SCAN  06/12/2018  . COLONOSCOPY  09/15/2018  . TETANUS/TDAP  11/24/2021  . INFLUENZA VACCINE  Completed    No Known Allergies  No outpatient encounter medications on file as of 02/01/2018.   No facility-administered encounter medications on file as of 02/01/2018.     Review of Systems  Constitutional: Negative for chills, fever and malaise/fatigue.  HENT: Negative for hearing loss.   Eyes: Negative for blurred vision (glasses).  Respiratory: Negative for cough and shortness of breath.   Cardiovascular: Negative for chest pain, palpitations and leg swelling.  Gastrointestinal: Negative for abdominal pain, blood in stool, constipation, diarrhea, heartburn and melena.  Musculoskeletal: Positive for  joint pain and myalgias. Negative for falls.       Right elbow/posterior upper arm  Skin: Negative for itching and rash.  Neurological: Negative for dizziness and loss of consciousness.  Endo/Heme/Allergies: Does not bruise/bleed easily.  Psychiatric/Behavioral: Negative for depression and memory loss. The patient does not have insomnia.        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 Physical Exam Vitals signs reviewed.  Constitutional:      General: She is not in acute distress.    Appearance: Normal appearance. She is normal weight. She is not toxic-appearing.  HENT:     Head: Normocephalic and atraumatic.     Right Ear: Tympanic membrane, ear canal and external ear normal. There is no impacted cerumen.     Left Ear: Tympanic membrane, ear canal and external ear normal. There is no impacted cerumen.     Nose: Nose normal.     Mouth/Throat:     Pharynx: Oropharynx is clear.  Eyes:     Extraocular Movements: Extraocular movements intact.     Conjunctiva/sclera: Conjunctivae normal.     Pupils: Pupils are equal, round, and reactive to light.  Neck:     Musculoskeletal: Neck supple.  Cardiovascular:     Rate and Rhythm: Normal rate and regular rhythm.     Pulses: Normal pulses.     Heart sounds: Normal heart sounds.  Pulmonary:     Effort: Pulmonary effort is normal.     Breath sounds: Normal breath sounds.  Abdominal:     General: Bowel sounds are normal.  Musculoskeletal: Normal range of motion.        General: No tenderness.  Skin:    General: Skin is warm and dry.     Capillary Refill: Capillary refill takes less than 2 seconds.  Neurological:     General: No focal deficit present.     Mental Status: She is alert and oriented to person,  place, and time. Mental status is at baseline.     Cranial Nerves: No cranial nerve deficit.     Motor: No weakness.     Gait: Gait normal.  Psychiatric:        Mood and Affect: Mood normal.        Behavior: Behavior normal.        Thought Content: Thought content normal.        Judgment: Judgment normal.     Labs reviewed: Basic Metabolic Panel: Recent Labs    02/04/17 0817  NA 140  K 4.5  CL 106  CO2 28  GLUCOSE 100*  BUN 16  CREATININE 0.70  CALCIUM 9.8  Liver Function Tests: Recent Labs    02/04/17 0817  AST 16  ALT 19  BILITOT 0.5  PROT 6.6   No results for input(s): LIPASE, AMYLASE in the last 8760 hours. No results for input(s): AMMONIA in the last 8760 hours. CBC: Recent Labs    02/04/17 0817  WBC 5.2  NEUTROABS 2,886  HGB 13.2  HCT 37.9  MCV 86.3  PLT 269    Assessment/Plan 1. Annual physical exam - performed today, needs to see gyn for pap and mammogram - CBC with Differential/Platelet; Future - COMPLETE METABOLIC PANEL WITH GFR; Future - Lipid panel; Future  2. Dyslipidemia - cont diet and exercise, get back on track after holidays - Lipid panel; Future  3. Right elbow tendonitis -given instructions to manage short-term -if not improving, see sports medicine, Dr. Margaretha Sheffieldraper who she saw before for her left shoulder  Labs/tests ordered:   Orders Placed This Encounter  Procedures  . CBC with Differential/Platelet    Standing Status:   Future    Standing Expiration Date:   04/13/2018  . COMPLETE METABOLIC PANEL WITH GFR    Standing Status:   Future    Standing Expiration Date:   04/13/2018  . Lipid panel    Standing Status:   Future    Standing Expiration Date:   04/13/2018   F/u in 1 year for CPE  Velma Agnes L. Abrey Bradway, D.O. Geriatrics [facility] [address] [place], [place] [address] Cell Phone (Mon-Fri 8am-5pm):  [phone] On Call:  [phone] & follow prompts after 5pm & weekends Office Phone:   [phone] Office Fax:  [phone]

## 2018-02-24 ENCOUNTER — Other Ambulatory Visit: Payer: PRIVATE HEALTH INSURANCE

## 2018-02-24 DIAGNOSIS — E785 Hyperlipidemia, unspecified: Secondary | ICD-10-CM

## 2018-02-24 DIAGNOSIS — Z0000 Encounter for general adult medical examination without abnormal findings: Secondary | ICD-10-CM

## 2018-02-25 LAB — CBC WITH DIFFERENTIAL/PLATELET
Absolute Monocytes: 433 cells/uL (ref 200–950)
Basophils Absolute: 68 cells/uL (ref 0–200)
Basophils Relative: 1.2 %
Eosinophils Absolute: 234 cells/uL (ref 15–500)
Eosinophils Relative: 4.1 %
HCT: 40.3 % (ref 35.0–45.0)
Hemoglobin: 13.4 g/dL (ref 11.7–15.5)
Lymphs Abs: 1573 cells/uL (ref 850–3900)
MCH: 29.7 pg (ref 27.0–33.0)
MCHC: 33.3 g/dL (ref 32.0–36.0)
MCV: 89.4 fL (ref 80.0–100.0)
MPV: 10.4 fL (ref 7.5–12.5)
Monocytes Relative: 7.6 %
Neutro Abs: 3392 cells/uL (ref 1500–7800)
Neutrophils Relative %: 59.5 %
Platelets: 288 10*3/uL (ref 140–400)
RBC: 4.51 10*6/uL (ref 3.80–5.10)
RDW: 12.8 % (ref 11.0–15.0)
Total Lymphocyte: 27.6 %
WBC: 5.7 10*3/uL (ref 3.8–10.8)

## 2018-02-25 LAB — COMPLETE METABOLIC PANEL WITH GFR
AG Ratio: 2.5 (calc) (ref 1.0–2.5)
ALT: 25 U/L (ref 6–29)
AST: 22 U/L (ref 10–35)
Albumin: 4.8 g/dL (ref 3.6–5.1)
Alkaline phosphatase (APISO): 68 U/L (ref 37–153)
BUN: 17 mg/dL (ref 7–25)
CO2: 27 mmol/L (ref 20–32)
Calcium: 9.8 mg/dL (ref 8.6–10.4)
Chloride: 106 mmol/L (ref 98–110)
Creat: 0.78 mg/dL (ref 0.50–0.99)
GFR, Est African American: 95 mL/min/{1.73_m2} (ref >=60)
GFR, Est Non African American: 82 mL/min/{1.73_m2} (ref >=60)
Globulin: 1.9 g/dL (calc) (ref 1.9–3.7)
Glucose, Bld: 99 mg/dL (ref 65–99)
Potassium: 4.7 mmol/L (ref 3.5–5.3)
Sodium: 141 mmol/L (ref 135–146)
Total Bilirubin: 0.5 mg/dL (ref 0.2–1.2)
Total Protein: 6.7 g/dL (ref 6.1–8.1)

## 2018-02-25 LAB — LIPID PANEL
Cholesterol: 222 mg/dL — ABNORMAL HIGH (ref <200)
HDL: 52 mg/dL (ref >=50)
LDL Cholesterol (Calc): 143 mg/dL (calc) — ABNORMAL HIGH
Non-HDL Cholesterol (Calc): 170 mg/dL (calc) — ABNORMAL HIGH (ref <130)
Total CHOL/HDL Ratio: 4.3 (calc) (ref <5.0)
Triglycerides: 144 mg/dL (ref <150)

## 2018-10-22 ENCOUNTER — Other Ambulatory Visit: Payer: Self-pay

## 2018-10-22 ENCOUNTER — Ambulatory Visit (INDEPENDENT_AMBULATORY_CARE_PROVIDER_SITE_OTHER): Payer: PRIVATE HEALTH INSURANCE

## 2018-10-22 DIAGNOSIS — Z23 Encounter for immunization: Secondary | ICD-10-CM | POA: Diagnosis not present

## 2019-01-30 ENCOUNTER — Ambulatory Visit: Payer: BLUE CROSS/BLUE SHIELD | Attending: Internal Medicine

## 2019-01-30 DIAGNOSIS — Z23 Encounter for immunization: Secondary | ICD-10-CM | POA: Insufficient documentation

## 2019-01-30 NOTE — Progress Notes (Signed)
   Covid-19 Vaccination Clinic  Name:  LEXXIE WINBERG    MRN: [id number] DOB: January 16, 1955  01/30/2019  Ms. Frankson was observed post Covid-19 immunization for 15 minutes without incidence. She was provided with Vaccine Information Sheet and instruction to access the V-Safe system.   Ms. Mokry was instructed to call 911 with any severe reactions post vaccine: Marland Kitchen Difficulty breathing  . Swelling of your face and throat  . A fast heartbeat  . A bad rash all over your body  . Dizziness and weakness

## 2019-02-03 ENCOUNTER — Other Ambulatory Visit: Payer: Self-pay

## 2019-02-03 ENCOUNTER — Encounter: Payer: Self-pay | Admitting: Internal Medicine

## 2019-02-03 ENCOUNTER — Ambulatory Visit (INDEPENDENT_AMBULATORY_CARE_PROVIDER_SITE_OTHER): Payer: PRIVATE HEALTH INSURANCE | Admitting: Internal Medicine

## 2019-02-03 VITALS — BP 124/72 | HR 68 | Temp 97.8°F | Ht 67.0 in | Wt 160.1 lb

## 2019-02-03 DIAGNOSIS — E785 Hyperlipidemia, unspecified: Secondary | ICD-10-CM

## 2019-02-03 DIAGNOSIS — Z0000 Encounter for general adult medical examination without abnormal findings: Secondary | ICD-10-CM

## 2019-02-03 DIAGNOSIS — Z1283 Encounter for screening for malignant neoplasm of skin: Secondary | ICD-10-CM

## 2019-02-03 DIAGNOSIS — E2839 Other primary ovarian failure: Secondary | ICD-10-CM

## 2019-02-03 DIAGNOSIS — Z1231 Encounter for screening mammogram for malignant neoplasm of breast: Secondary | ICD-10-CM

## 2019-02-03 NOTE — Patient Instructions (Addendum)
Call back or send in mychart dosages of vitamin D3 and red yeast rice and Vitamin C so we can add them to your list.  Please schedule your mammogram and bone density.  Please also schedule your eye exam.  Please bring [place] a copy of your living will and HCPOA.  [email or web address]  Please arrange your cologuard test.

## 2019-02-03 NOTE — Progress Notes (Signed)
Location:  [facility] clinic  Provider: Dr. Bufford Spikes  Goals of Care:  Advanced Directives 02/03/2019  Does Patient Have a Medical Advance Directive? Yes  Type of Advance Directive Healthcare Power of Attorney  Does patient want to make changes to medical advance directive? No - Patient declined  Copy of Healthcare Power of Attorney in Chart? -  Would patient like information on creating a medical advance directive? -     Chief Complaint  Patient presents with  . Annual Exam    Annual Physical , Rt toe pain     HPI: Patient is a 64 y.o. female seen today for annual physical examination.   Not fasting for lab work.   She is doing well. 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 She has been worried about her mother, who is quite frail at the moment. Her sister has traveled to help at this time.   Unhappy about her weight. She is a little heavier than normal. Exercises 5-6 times a week. She walks in her neighborhood. Also does strength training and cardio workout with a trainer twice a week.   Takes vitamin D3 daily, does not know the amount. Takes vitamin C daily. Also taking cholesterol complete, has red yeast rice.   Eats three meals a day. Will have a morning snack. Foods are either fruits and vegetables. She tries to avoid high fat foods due to her family history of high cholesterol.   No recent injuries or falls.   PAP and obgyn visit was November 2020.   She is planning on scheduling mammogram in next few months.   Will schedule eye appointment with Dr. Dione Booze.   Interested in doing cologuard.   Just had root canal and crown placement December 2020.   She would like to referral to Dr. Sharyn Lull dermatologist.   She has gotten her her first covid vaccine. Will receive her second shot February 2nd.   She does have ACP paperwork completed. She keeps forgetting to bring copy for PCP.   Past Medical History:  Diagnosis Date  . Anxiety state, unspecified   .  Contusion of buttock   . Hematoma 10/06/10   buttock   . Symptomatic menopausal or female climacteric states   . Thrombophlebitis leg 1990  . Varicose vein of leg   . Varicose veins     Past Surgical History:  Procedure Laterality Date  . CHOLECYSTECTOMY  December 1990   Dr.Matt Daphine Deutscher  . COLONOSCOPY  09/14/2008   Diverticulosis, Dr.Medoff   . ENDOMETRIAL ABLATION  2006   Dr.Ron Neal   . VARICOSE VEIN SURGERY Left 10/12/2013   [facility]    No Known Allergies  No outpatient encounter medications on file as of 02/03/2019.   No facility-administered encounter medications on file as of 02/03/2019.    Review of Systems:  Review of Systems  Constitutional: Negative for activity change, appetite change and fatigue.  HENT: Negative for dental problem, hearing loss and trouble swallowing.   Eyes: Negative for photophobia and visual disturbance.       Glasses  Respiratory: Negative for shortness of breath and wheezing.   Cardiovascular: Negative for chest pain and leg swelling.  Gastrointestinal: Negative for abdominal pain, constipation, diarrhea and nausea.  Endocrine: Negative for polydipsia, polyphagia and polyuria.  Genitourinary: Positive for vaginal bleeding. Negative for dysuria and hematuria.  Musculoskeletal: Negative for arthralgias and myalgias.  Skin: Negative.   Neurological: Negative for dizziness, weakness and headaches.  Psychiatric/Behavioral: Negative  for dysphoric mood and sleep disturbance. The patient is not nervous/anxious.     Health Maintenance  Topic Date Due  . MAMMOGRAM  05/09/2018  . DEXA SCAN  06/12/2018  . COLONOSCOPY  09/15/2018  . Hepatitis C Screening  02/03/2020 (Originally July 30, 1955)  . HIV Screening  02/03/2020 (Originally 09/23/1970)  . PAP SMEAR-Modifier  11/14/2021  . TETANUS/TDAP  11/24/2021  . INFLUENZA VACCINE  Completed    Physical Exam: Vitals:   02/03/19 0909  BP: 124/72  Pulse: 68  Temp: 97.8 F (36.6 C)    TempSrc: Temporal  SpO2: 96%  Weight: 160 lb 1.6 oz (72.6 kg)  Height: 5\' 7"  (1.702 m)   Body mass index is 25.08 kg/m. Physical Exam Vitals reviewed.  Constitutional:      General: She is not in acute distress.    Appearance: Normal appearance. She is normal weight.  HENT:     Head: Normocephalic.     Right Ear: Tympanic membrane normal. There is no impacted cerumen.     Left Ear: Tympanic membrane normal. There is no impacted cerumen.     Mouth/Throat:     Mouth: Mucous membranes are dry.  Eyes:     General:        Right eye: No discharge.        Left eye: No discharge.     Extraocular Movements: Extraocular movements intact.     Pupils: Pupils are equal, round, and reactive to light.  Neck:     Thyroid: No thyroid mass, thyromegaly or thyroid tenderness.  Cardiovascular:     Rate and Rhythm: Normal rate and regular rhythm.     Pulses: Normal pulses.     Heart sounds: Normal heart sounds. No murmur.  Pulmonary:     Effort: Pulmonary effort is normal.     Breath sounds: Normal breath sounds.  Abdominal:     General: Abdomen is flat. Bowel sounds are normal.     Palpations: Abdomen is soft.  Musculoskeletal:        General: Normal range of motion.     Right lower leg: No edema.     Left lower leg: No edema.  Skin:    General: Skin is warm and dry.     Capillary Refill: Capillary refill takes less than 2 seconds.  Neurological:     General: No focal deficit present.     Mental Status: She is alert and oriented to person, place, and time. Mental status is at baseline.  Psychiatric:        Mood and Affect: Mood normal.        Behavior: Behavior normal.        Thought Content: Thought content normal.        Judgment: Judgment normal.     Labs reviewed: Basic Metabolic Panel: Recent Labs    02/24/18 0819  NA 141  K 4.7  CL 106  CO2 27  GLUCOSE 99  BUN 17  CREATININE 0.78  CALCIUM 9.8   Liver Function Tests: Recent Labs    02/24/18 0819  AST 22   ALT 25  BILITOT 0.5  PROT 6.7   No results for input(s): LIPASE, AMYLASE in the last 8760 hours. No results for input(s): AMMONIA in the last 8760 hours. CBC: Recent Labs    02/24/18 0819  WBC 5.7  NEUTROABS 3,392  HGB 13.4  HCT 40.3  MCV 89.4  PLT 288   Lipid Panel: Recent Labs    02/24/18 0819  CHOL 222*  HDL 52  LDLCALC 143*  TRIG 144  CHOLHDL 4.3   No results found for: HGBA1C  Procedures since last visit: No results found.  Assessment/Plan 1. Annual physical exam - performed today - PAP by Dr. Jennette Kettle in 2020, last will be at age 30 - complete blood panel with differential- future - complete metabolic panel with GFR-future - lipid panel- future  2. Dyslipidemia - she is not fasting today for lab work, she will come within the next few weeks - overall she maintains a healthy diet, but has a family history of high cholesterol - lipid panel- future  3. Estrogen deficiency - she is post menopausal and has a higher risk for osteoporosis - referral for DEXA scan- today - continue weight bearing exercise 3-5 times a week - recommend vitamin D3, 5,000 units daily  4. Encounter for screening mammogram for malignant neoplasm of breast - referral for mammogram and DEXA scan- today  5. Screening exam for skin cancer - referral for dermatologist- today   Labs/tests ordered:  Complete blood count with differential/platelets, complete metabolic panel with GFR, lipid panel- future Next appt:  1 year follow up

## 2019-02-17 ENCOUNTER — Ambulatory Visit: Payer: PRIVATE HEALTH INSURANCE | Attending: Internal Medicine

## 2019-02-17 DIAGNOSIS — Z23 Encounter for immunization: Secondary | ICD-10-CM | POA: Insufficient documentation

## 2019-02-17 NOTE — Progress Notes (Signed)
   Covid-19 Vaccination Clinic  Name:  Judith Oconnor    MRN: [id number] DOB: 06/09/1955  02/17/2019  [name] was observed post Covid-19 immunization for 15 minutes without incidence. She was provided with Vaccine Information Sheet and instruction to access the V-Safe system.   [name] was instructed to call 911 with any severe reactions post vaccine: Marland Kitchen Difficulty breathing  . Swelling of your face and throat  . A fast heartbeat  . A bad rash all over your body  . Dizziness and weakness    Immunizations Administered    Name Date Dose VIS Date Route   Pfizer COVID-19 Vaccine 02/17/2019  9:13 AM 0.3 mL 12/24/2018 Intramuscular   Manufacturer: [employer], [employer]   Lot: [id number]   NDC: [address]-1

## 2019-02-23 ENCOUNTER — Other Ambulatory Visit: Payer: Self-pay

## 2019-02-23 ENCOUNTER — Other Ambulatory Visit: Payer: PRIVATE HEALTH INSURANCE

## 2019-02-23 DIAGNOSIS — E785 Hyperlipidemia, unspecified: Secondary | ICD-10-CM

## 2019-02-23 DIAGNOSIS — Z0000 Encounter for general adult medical examination without abnormal findings: Secondary | ICD-10-CM

## 2019-02-24 ENCOUNTER — Encounter: Payer: Self-pay | Admitting: *Deleted

## 2019-02-24 LAB — LIPID PANEL
Cholesterol: 229 mg/dL — ABNORMAL HIGH (ref <200)
HDL: 58 mg/dL (ref >=50)
LDL Cholesterol (Calc): 134 mg/dL (calc) — ABNORMAL HIGH
Non-HDL Cholesterol (Calc): 171 mg/dL (calc) — ABNORMAL HIGH (ref <130)
Total CHOL/HDL Ratio: 3.9 (calc) (ref <5.0)
Triglycerides: 230 mg/dL — ABNORMAL HIGH (ref <150)

## 2019-02-24 LAB — COMPLETE METABOLIC PANEL WITH GFR
AG Ratio: 2.1 (calc) (ref 1.0–2.5)
ALT: 26 U/L (ref 6–29)
AST: 20 U/L (ref 10–35)
Albumin: 4.7 g/dL (ref 3.6–5.1)
Alkaline phosphatase (APISO): 67 U/L (ref 37–153)
BUN: 13 mg/dL (ref 7–25)
CO2: 29 mmol/L (ref 20–32)
Calcium: 9.8 mg/dL (ref 8.6–10.4)
Chloride: 104 mmol/L (ref 98–110)
Creat: 0.76 mg/dL (ref 0.50–0.99)
GFR, Est African American: 97 mL/min/{1.73_m2} (ref >=60)
GFR, Est Non African American: 84 mL/min/{1.73_m2} (ref >=60)
Globulin: 2.2 g/dL (calc) (ref 1.9–3.7)
Glucose, Bld: 95 mg/dL (ref 65–99)
Potassium: 4.5 mmol/L (ref 3.5–5.3)
Sodium: 142 mmol/L (ref 135–146)
Total Bilirubin: 0.4 mg/dL (ref 0.2–1.2)
Total Protein: 6.9 g/dL (ref 6.1–8.1)

## 2019-02-24 LAB — HEMOGLOBIN A1C
Hgb A1c MFr Bld: 5.3 % of total Hgb (ref <5.7)
Mean Plasma Glucose: 105 (calc)
eAG (mmol/L): 5.8 (calc)

## 2019-02-24 LAB — CBC WITH DIFFERENTIAL/PLATELET
Absolute Monocytes: 422 cells/uL (ref 200–950)
Basophils Absolute: 68 cells/uL (ref 0–200)
Basophils Relative: 1.2 %
Eosinophils Absolute: 274 cells/uL (ref 15–500)
Eosinophils Relative: 4.8 %
HCT: 39 % (ref 35.0–45.0)
Hemoglobin: 13.4 g/dL (ref 11.7–15.5)
Lymphs Abs: 1892 cells/uL (ref 850–3900)
MCH: 30.6 pg (ref 27.0–33.0)
MCHC: 34.4 g/dL (ref 32.0–36.0)
MCV: 89 fL (ref 80.0–100.0)
MPV: 10.4 fL (ref 7.5–12.5)
Monocytes Relative: 7.4 %
Neutro Abs: 3044 cells/uL (ref 1500–7800)
Neutrophils Relative %: 53.4 %
Platelets: 240 10*3/uL (ref 140–400)
RBC: 4.38 10*6/uL (ref 3.80–5.10)
RDW: 12.5 % (ref 11.0–15.0)
Total Lymphocyte: 33.2 %
WBC: 5.7 10*3/uL (ref 3.8–10.8)

## 2019-02-24 NOTE — Progress Notes (Signed)
Sugar average is in the normal range at 5.3 Cholesterol remains well above goal at 134 (goal is less than 100 for her)  Triglycerides are also now quite high at 230.   She does have a good HDL. Electrolytes, liver, kidneys and glucose were all normal. Blood counts normal.

## 2019-04-01 ENCOUNTER — Other Ambulatory Visit: Payer: Self-pay

## 2019-04-01 ENCOUNTER — Ambulatory Visit
Admission: RE | Admit: 2019-04-01 | Discharge: 2019-04-01 | Disposition: A | Discharge status: Home / Self Care | Payer: PRIVATE HEALTH INSURANCE | Source: Ambulatory Visit | Attending: Internal Medicine | Admitting: Internal Medicine

## 2019-04-01 DIAGNOSIS — Z0000 Encounter for general adult medical examination without abnormal findings: Secondary | ICD-10-CM

## 2019-04-01 DIAGNOSIS — Z1231 Encounter for screening mammogram for malignant neoplasm of breast: Secondary | ICD-10-CM

## 2019-04-05 ENCOUNTER — Encounter: Payer: Self-pay | Admitting: *Deleted

## 2019-05-16 ENCOUNTER — Encounter: Payer: Self-pay | Admitting: Internal Medicine

## 2019-05-16 NOTE — Telephone Encounter (Signed)
Ms.Kuehnle,Dr.Reed is out of the office today.Can you schedule an appointment this week to address the concerns. Thank You  Deangela Randleman FNP-C

## 2019-05-16 NOTE — Telephone Encounter (Signed)
Message routed to Ngetich, Dinah, NP due to PCP Reed, Tiffany L, DO being out of office. Please Advise.  

## 2019-05-17 NOTE — Telephone Encounter (Signed)
Patient daughter, Helaina called and wanted Dr. Renato Gails to address. Forwarded message to Dr. Renato Gails.

## 2019-05-25 ENCOUNTER — Encounter: Payer: Self-pay | Admitting: Internal Medicine

## 2019-09-14 ENCOUNTER — Encounter (HOSPITAL_COMMUNITY): Payer: Self-pay

## 2019-09-14 ENCOUNTER — Emergency Department (HOSPITAL_COMMUNITY): Payer: PRIVATE HEALTH INSURANCE

## 2019-09-14 ENCOUNTER — Emergency Department (HOSPITAL_COMMUNITY)
Admission: EM | Admit: 2019-09-14 | Discharge: 2019-09-14 | Disposition: A | Discharge status: Home / Self Care | Payer: PRIVATE HEALTH INSURANCE | Attending: Emergency Medicine | Admitting: Emergency Medicine

## 2019-09-14 ENCOUNTER — Other Ambulatory Visit: Payer: Self-pay

## 2019-09-14 DIAGNOSIS — R4781 Slurred speech: Secondary | ICD-10-CM | POA: Diagnosis present

## 2019-09-14 DIAGNOSIS — E782 Mixed hyperlipidemia: Secondary | ICD-10-CM | POA: Insufficient documentation

## 2019-09-14 DIAGNOSIS — Z79899 Other long term (current) drug therapy: Secondary | ICD-10-CM | POA: Insufficient documentation

## 2019-09-14 DIAGNOSIS — Z20822 Contact with and (suspected) exposure to covid-19: Secondary | ICD-10-CM | POA: Insufficient documentation

## 2019-09-14 DIAGNOSIS — E7800 Pure hypercholesterolemia, unspecified: Secondary | ICD-10-CM

## 2019-09-14 DIAGNOSIS — Z7982 Long term (current) use of aspirin: Secondary | ICD-10-CM | POA: Insufficient documentation

## 2019-09-14 DIAGNOSIS — I639 Cerebral infarction, unspecified: Secondary | ICD-10-CM

## 2019-09-14 HISTORY — DX: Cerebral infarction, unspecified: I63.9

## 2019-09-14 LAB — LIPID PANEL
Cholesterol: 237 mg/dL — ABNORMAL HIGH (ref 0–200)
HDL: 50 mg/dL (ref >40)
LDL Cholesterol: 139 mg/dL — ABNORMAL HIGH (ref 0–99)
Total CHOL/HDL Ratio: 4.7 RATIO
Triglycerides: 238 mg/dL — ABNORMAL HIGH (ref <150)
VLDL: 48 mg/dL — ABNORMAL HIGH (ref 0–40)

## 2019-09-14 LAB — URINALYSIS, ROUTINE W REFLEX MICROSCOPIC
Bilirubin Urine: NEGATIVE
Glucose, UA: NEGATIVE mg/dL
Hgb urine dipstick: NEGATIVE
Ketones, ur: NEGATIVE mg/dL
Leukocytes,Ua: NEGATIVE
Nitrite: NEGATIVE
Protein, ur: NEGATIVE mg/dL
Specific Gravity, Urine: 1.016 (ref 1.005–1.030)
pH: 6 (ref 5.0–8.0)

## 2019-09-14 LAB — I-STAT CHEM 8, ED
BUN: 14 mg/dL (ref 8–23)
Calcium, Ion: 1.22 mmol/L (ref 1.15–1.40)
Chloride: 102 mmol/L (ref 98–111)
Creatinine, Ser: 0.7 mg/dL (ref 0.44–1.00)
Glucose, Bld: 107 mg/dL — ABNORMAL HIGH (ref 70–99)
HCT: 35 % — ABNORMAL LOW (ref 36.0–46.0)
Hemoglobin: 11.9 g/dL — ABNORMAL LOW (ref 12.0–15.0)
Potassium: 3.5 mmol/L (ref 3.5–5.1)
Sodium: 139 mmol/L (ref 135–145)
TCO2: 27 mmol/L (ref 22–32)

## 2019-09-14 LAB — CBC
HCT: 37.3 % (ref 36.0–46.0)
Hemoglobin: 12.4 g/dL (ref 12.0–15.0)
MCH: 30.1 pg (ref 26.0–34.0)
MCHC: 33.2 g/dL (ref 30.0–36.0)
MCV: 90.5 fL (ref 80.0–100.0)
Platelets: 238 10*3/uL (ref 150–400)
RBC: 4.12 MIL/uL (ref 3.87–5.11)
RDW: 12.7 % (ref 11.5–15.5)
WBC: 6.5 10*3/uL (ref 4.0–10.5)
nRBC: 0 % (ref 0.0–0.2)

## 2019-09-14 LAB — COMPREHENSIVE METABOLIC PANEL
ALT: 24 U/L (ref 0–44)
AST: 20 U/L (ref 15–41)
Albumin: 4.3 g/dL (ref 3.5–5.0)
Alkaline Phosphatase: 60 U/L (ref 38–126)
Anion gap: 14 (ref 5–15)
BUN: 13 mg/dL (ref 8–23)
CO2: 23 mmol/L (ref 22–32)
Calcium: 9.5 mg/dL (ref 8.9–10.3)
Chloride: 102 mmol/L (ref 98–111)
Creatinine, Ser: 0.77 mg/dL (ref 0.44–1.00)
GFR calc Af Amer: >60 mL/min (ref >60)
GFR calc non Af Amer: >60 mL/min (ref >60)
Glucose, Bld: 117 mg/dL — ABNORMAL HIGH (ref 70–99)
Potassium: 3.4 mmol/L — ABNORMAL LOW (ref 3.5–5.1)
Sodium: 139 mmol/L (ref 135–145)
Total Bilirubin: 0.7 mg/dL (ref 0.3–1.2)
Total Protein: 6.7 g/dL (ref 6.5–8.1)

## 2019-09-14 LAB — RAPID URINE DRUG SCREEN, HOSP PERFORMED
Amphetamines: NOT DETECTED
Barbiturates: NOT DETECTED
Benzodiazepines: NOT DETECTED
Cocaine: NOT DETECTED
Opiates: NOT DETECTED
Tetrahydrocannabinol: NOT DETECTED

## 2019-09-14 LAB — SARS CORONAVIRUS 2 BY RT PCR (HOSPITAL ORDER, PERFORMED IN ~~LOC~~ HOSPITAL LAB): SARS Coronavirus 2: NEGATIVE

## 2019-09-14 LAB — CBG MONITORING, ED: Glucose-Capillary: 110 mg/dL — ABNORMAL HIGH (ref 70–99)

## 2019-09-14 LAB — HEMOGLOBIN A1C
Hgb A1c MFr Bld: 4.9 % (ref 4.8–5.6)
Mean Plasma Glucose: 93.93 mg/dL

## 2019-09-14 MED ORDER — LORAZEPAM 2 MG/ML IJ SOLN
1.0000 mg | Freq: Once | INTRAMUSCULAR | Status: AC
  Administered 2019-09-14: 1 mg via INTRAVENOUS
  Filled 2019-09-14: qty 1

## 2019-09-14 MED ORDER — ASPIRIN 81 MG PO CHEW: 81.0000 mg | CHEWABLE_TABLET | Freq: Every day | ORAL | 0 refills | Status: AC

## 2019-09-14 MED ORDER — ATORVASTATIN CALCIUM 10 MG PO TABS: 20.0000 mg | ORAL_TABLET | Freq: Every day | ORAL | 0 refills | Status: DC

## 2019-09-14 MED ORDER — ASPIRIN 81 MG PO CHEW
81.0000 mg | CHEWABLE_TABLET | Freq: Once | ORAL | Status: AC
  Administered 2019-09-14: 81 mg via ORAL
  Filled 2019-09-14: qty 1

## 2019-09-14 NOTE — ED Triage Notes (Signed)
Pt arrived to ED via [facility] d/t stroke-like s/s. LKN was 1620 while she was typing on the computer. She stated that her speech became slurred & she felt dizzy. She informed this RN that she called 911 & within 15 minutes while she was still on the phone her slurred speech resolved. EMS reports that pt was c/o slowed speech while in route to ED. Pt is A/Ox4, verbal-able to make need known.

## 2019-09-14 NOTE — Discharge Instructions (Signed)
Your imaging was reassuring today. Your symptoms may have been related to a complex migraine vs a TIA (also known as a mini stroke). Your cholesterol was elevated today and therefore we have started you on medication for this - please take as prescribed.   Follow up with your PCP as well as [facility] regarding your ED visit. Your PCP or the neurologist can schedule an outpatient cardiac ECHO (ultrasound of your heart) to ensure that it is healthy.   Return to the ED for any worsening symptoms

## 2019-09-14 NOTE — ED Notes (Signed)
Patient transported to MRI 

## 2019-09-14 NOTE — ED Provider Notes (Signed)
[facility]   CSN: [id number] Arrival date & time: 09/14/19  1718     History No chief complaint on file.   Judith Oconnor is a 64 y.o. female without significant PMHx who presents to the ED today via EMS with complaint of slurred speech. Pt reports she was at work this afternoon when around 4:15 PM she began noticing that her typing was "off" she reports she was not typing appropriately and skipping words. 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 Pt reports that they called EMS and her symptoms resolved on their own approximately 15 minutes later. She reports when EMS arrived she still felt like she was talking slower than normal however states this has resolved as well. Pt says her son thought the right side of her face was slightly drooped; pt did not look at her face in the mirror to confirm. No weakness or numbness unilaterally. No headache. No other complaints.   The history is provided by the patient and medical records.       Past Medical History:  Diagnosis Date  . Anxiety state, unspecified   . Contusion of buttock   . Hematoma 10/06/10   buttock   . Symptomatic menopausal or female climacteric states   . Thrombophlebitis leg 1990  . Varicose vein of leg   . Varicose veins     Patient Active Problem List   Diagnosis Date Noted  . Right elbow tendonitis 02/01/2018  . Dyslipidemia 01/03/2015  . Palpitations 01/03/2015  . Varicose veins of leg with complications 10/18/2013  . Anxiety state     Past Surgical History:  Procedure Laterality Date  . CHOLECYSTECTOMY  December 1990   Dr.Matt Daphine DeutscherMartin  . COLONOSCOPY  09/14/2008   Diverticulosis, Dr.Medoff   . ENDOMETRIAL ABLATION  2006   Dr.Ron Jennette KettleNeal   . VARICOSE VEIN SURGERY Left 10/12/2013   [facility]     OB History   No obstetric history on file.      Family History  Problem Relation Age of Onset  . Macular degeneration Mother   . Coronary artery disease Father   . Breast cancer Neg Hx     Social History   Tobacco Use  . Smoking status: Never Smoker  . Smokeless tobacco: Never Used  Substance Use Topics  . Alcohol use: Yes    Alcohol/week: 6.0 - 8.0 standard drinks    Types: 6 - 8 Glasses of wine per week  . Drug use: No    Home Medications Prior to Admission medications   Medication Sig Start Date End Date Taking? Authorizing Provider  aspirin 81 MG chewable tablet Chew 1 tablet (81 mg total) by mouth daily. 09/14/19 10/14/19  Hyman HopesVenter, Cassandria Drew, PA-C  atorvastatin (LIPITOR) 10 MG tablet Take 2 tablets (20 mg total) by mouth daily. 09/14/19 10/14/19  Tanda RockersVenter, Jovonda Selner, PA-C    Allergies    Patient has no known allergies.  Review of Systems   Review of Systems  Constitutional: Negative for chills and fever.  Eyes: Negative for visual disturbance.  Neurological: Positive for speech difficulty. Negative for syncope, weakness, numbness and headaches.  All other systems reviewed and are negative.   Physical Exam Updated Vital Signs BP (!) 162/78 (BP Location: Left Arm)   Pulse 81   Temp 98.7 F (37.1 C) (Oral)   Resp 16  SpO2 100%   Physical Exam Vitals and nursing note reviewed.  Constitutional:      Appearance: She is not ill-appearing or diaphoretic.  HENT:     Head: Normocephalic and atraumatic.  Eyes:     Extraocular Movements: Extraocular movements intact.     Conjunctiva/sclera: Conjunctivae normal.     Pupils: Pupils are equal, round, and reactive to light.  Cardiovascular:     Rate and Rhythm: Normal rate and regular rhythm.     Pulses: Normal pulses.  Pulmonary:     Effort: Pulmonary effort is normal.     Breath sounds: Normal breath sounds. No wheezing, rhonchi or rales.  Abdominal:     Palpations: Abdomen is soft.     Tenderness: There is no abdominal tenderness. There is no guarding or rebound.   Musculoskeletal:     Cervical back: Neck supple.  Skin:    General: Skin is warm and dry.  Neurological:     Mental Status: She is alert.     Comments: CN 3-12 grossly intact. No facial droop appreciated.  A&O x4 GCS 15 Sensation and strength intact Coordination with finger-to-nose WNL Neg romberg, neg pronator drift     ED Results / Procedures / Treatments   Labs (all labs ordered are listed, but only abnormal results are displayed) Labs Reviewed  COMPREHENSIVE METABOLIC PANEL - Abnormal; Notable for the following components:      Result Value   Potassium 3.4 (*)    Glucose, Bld 117 (*)    All other components within normal limits  LIPID PANEL - Abnormal; Notable for the following components:   Cholesterol 237 (*)    Triglycerides 238 (*)    VLDL 48 (*)    LDL Cholesterol 139 (*)    All other components within normal limits  CBG MONITORING, ED - Abnormal; Notable for the following components:   Glucose-Capillary 110 (*)    All other components within normal limits  I-STAT CHEM 8, ED - Abnormal; Notable for the following components:   Glucose, Bld 107 (*)    Hemoglobin 11.9 (*)    HCT 35.0 (*)    All other components within normal limits  SARS CORONAVIRUS 2 BY RT PCR (HOSPITAL ORDER, PERFORMED IN [facility])  RAPID URINE DRUG SCREEN, HOSP PERFORMED  CBC  URINALYSIS, ROUTINE W REFLEX MICROSCOPIC  HEMOGLOBIN A1C    EKG None  Radiology CT HEAD WO CONTRAST  Result Date: 09/14/2019 CLINICAL DATA:  TIA EXAM: CT HEAD WITHOUT CONTRAST TECHNIQUE: Contiguous axial images were obtained from the base of the skull through the vertex without intravenous contrast. COMPARISON:  MRI 09/14/2019 FINDINGS: Brain: No acute territorial infarction, hemorrhage or intracranial mass. The ventricles are nonenlarged. Vascular: No hyperdense vessel or unexpected calcification. Skull: Normal. Negative for fracture or focal lesion. Sinuses/Orbits: No acute finding. Other: None  IMPRESSION: Negative non contrasted CT appearance of the brain. Electronically Signed   By: Jasmine Pang M.D.   On: 09/14/2019 19:58   MR ANGIO HEAD WO CONTRAST  Result Date: 09/14/2019 CLINICAL DATA:  Slurred speech and dizziness EXAM: MRI HEAD WITHOUT CONTRAST MRA HEAD WITHOUT CONTRAST MRA NECK WITHOUT CONTRAST TECHNIQUE: Multiplanar, multiecho pulse sequences of the brain and surrounding structures were obtained without intravenous contrast. Angiographic images of the Circle of Willis were obtained using MRA technique without intravenous contrast. Angiographic images of the neck were obtained using MRA technique without intravenous contrast. Carotid stenosis measurements (when applicable) are obtained utilizing NASCET criteria, using the distal  internal carotid diameter as the denominator. COMPARISON:  None. FINDINGS: MRI HEAD FINDINGS Brain: No acute infarct, acute hemorrhage or extra-axial collection. Normal white matter signal. Normal volume of CSF spaces. No chronic microhemorrhage. Normal midline structures. Vascular: Normal flow voids. Skull and upper cervical spine: Normal marrow signal. Sinuses/Orbits: Negative. Other: None. MRA HEAD FINDINGS POSTERIOR CIRCULATION: --Vertebral arteries: Normal V4 segments. --Inferior cerebellar arteries: Normal. --Basilar artery: Normal. --Superior cerebellar arteries: Normal. --Posterior cerebral arteries: Normal. ANTERIOR CIRCULATION: --Intracranial internal carotid arteries: Normal. --Anterior cerebral arteries (ACA): Normal. Both A1 segments are present. Patent anterior communicating artery (a-comm). --Middle cerebral arteries (MCA): Normal. MRA NECK FINDINGS Normal time-of-flight imaging of the carotid and vertebral arteries. The vertebral system is left dominant. No carotid bifurcation stenosis. IMPRESSION: 1. Normal MRI of the brain. 2. Normal MRA of the head and neck. Electronically Signed   By: Deatra Robinson M.D.   On: 09/14/2019 19:31   MR MRA NECK WO  CONTRAST  Result Date: 09/14/2019 CLINICAL DATA:  Slurred speech and dizziness EXAM: MRI HEAD WITHOUT CONTRAST MRA HEAD WITHOUT CONTRAST MRA NECK WITHOUT CONTRAST TECHNIQUE: Multiplanar, multiecho pulse sequences of the brain and surrounding structures were obtained without intravenous contrast. Angiographic images of the Circle of Willis were obtained using MRA technique without intravenous contrast. Angiographic images of the neck were obtained using MRA technique without intravenous contrast. Carotid stenosis measurements (when applicable) are obtained utilizing NASCET criteria, using the distal internal carotid diameter as the denominator. COMPARISON:  None. FINDINGS: MRI HEAD FINDINGS Brain: No acute infarct, acute hemorrhage or extra-axial collection. Normal white matter signal. Normal volume of CSF spaces. No chronic microhemorrhage. Normal midline structures. Vascular: Normal flow voids. Skull and upper cervical spine: Normal marrow signal. Sinuses/Orbits: Negative. Other: None. MRA HEAD FINDINGS POSTERIOR CIRCULATION: --Vertebral arteries: Normal V4 segments. --Inferior cerebellar arteries: Normal. --Basilar artery: Normal. --Superior cerebellar arteries: Normal. --Posterior cerebral arteries: Normal. ANTERIOR CIRCULATION: --Intracranial internal carotid arteries: Normal. --Anterior cerebral arteries (ACA): Normal. Both A1 segments are present. Patent anterior communicating artery (a-comm). --Middle cerebral arteries (MCA): Normal. MRA NECK FINDINGS Normal time-of-flight imaging of the carotid and vertebral arteries. The vertebral system is left dominant. No carotid bifurcation stenosis. IMPRESSION: 1. Normal MRI of the brain. 2. Normal MRA of the head and neck. Electronically Signed   By: Deatra Robinson M.D.   On: 09/14/2019 19:31   MR BRAIN WO CONTRAST  Result Date: 09/14/2019 CLINICAL DATA:  Slurred speech and dizziness EXAM: MRI HEAD WITHOUT CONTRAST MRA HEAD WITHOUT CONTRAST MRA NECK WITHOUT  CONTRAST TECHNIQUE: Multiplanar, multiecho pulse sequences of the brain and surrounding structures were obtained without intravenous contrast. Angiographic images of the Circle of Willis were obtained using MRA technique without intravenous contrast. Angiographic images of the neck were obtained using MRA technique without intravenous contrast. Carotid stenosis measurements (when applicable) are obtained utilizing NASCET criteria, using the distal internal carotid diameter as the denominator. COMPARISON:  None. FINDINGS: MRI HEAD FINDINGS Brain: No acute infarct, acute hemorrhage or extra-axial collection. Normal white matter signal. Normal volume of CSF spaces. No chronic microhemorrhage. Normal midline structures. Vascular: Normal flow voids. Skull and upper cervical spine: Normal marrow signal. Sinuses/Orbits: Negative. Other: None. MRA HEAD FINDINGS POSTERIOR CIRCULATION: --Vertebral arteries: Normal V4 segments. --Inferior cerebellar arteries: Normal. --Basilar artery: Normal. --Superior cerebellar arteries: Normal. --Posterior cerebral arteries: Normal. ANTERIOR CIRCULATION: --Intracranial internal carotid arteries: Normal. --Anterior cerebral arteries (ACA): Normal. Both A1 segments are present. Patent anterior communicating artery (a-comm). --Middle cerebral arteries (MCA): Normal. MRA NECK FINDINGS Normal time-of-flight imaging  of the carotid and vertebral arteries. The vertebral system is left dominant. No carotid bifurcation stenosis. IMPRESSION: 1. Normal MRI of the brain. 2. Normal MRA of the head and neck. Electronically Signed   By: Deatra Robinson M.D.   On: 09/14/2019 19:31    Procedures Procedures (including critical care time)  Medications Ordered in ED Medications  aspirin chewable tablet 81 mg (81 mg Oral Given 09/14/19 1813)  LORazepam (ATIVAN) injection 1 mg (1 mg Intravenous Given 09/14/19 1822)    ED Course  I have reviewed the triage vital signs and the nursing notes.  Pertinent  labs & imaging results that were available during my care of the patient were reviewed by me and considered in my medical decision making (see chart for details).  Clinical Course as of Sep 13 2100  Wed Sep 14, 2019  2026 Statin - Aspirin 81 mg, 20 lipitor. Follow up with PCP and neuro.    [MV]    Clinical Course User Index [MV] Tanda Rockers, PA-C   MDM Rules/Calculators/A&P                          64 year old female who presents to the ED via EMS for speech that lasted approximately 15 minutes.  Last known normal 4:15 PM today.  On arrival with EMS patient symptoms have completely resolved.  She has no history of CVA or TIA in the past.  She is an otherwise healthy 64 year old and does not appear to be on any medications.  On arrival to the ED patient is afebrile, nontachycardic nontachypneic and appears to be in no acute distress.  Her speech is clear currently.  She has no focal neuro deficits on exam today.  No obvious facial droop appreciated.  Strength and sensation equal throughout upper and lower extremities.  Suspect that patient may have had a TIA.  Have discussed this case with neurologist Dr. Marchelle Folks who recommends MRI brain, MRA head and neck, and to obtain lipid panel and a1c. He recommends to be re-paged once pt's work up is complete; if everything is within normal limits pt can have outpatient ECHO.   CBC without leukocytosis. hgb stable at 12.4.  CMP with potassium 3.4. Glucose 117. No other findings A1c 4.9 Lipid panel with cholesterol 237, triglycerides 238, HDL 48, and LDL 139.   MRI/MRA head and neck negative. Will plan to discuss with neurology again with recommendations.   Dr. Amada Jupiter recommends starting pt on 20 mg Lipitor and baby aspirin. Pt should follow up with both neurology as she may have had a complex migraine as well as her PCP who can order the outpatient ECHO. I have gone over these plans with pt and she is in agreement. Is stable for discharge  home.   This note was prepared using Dragon voice recognition software and may include unintentional dictation errors due to the inherent limitations of voice recognition software.  Final Clinical Impression(s) / ED Diagnoses Final diagnoses:  Slurred speech  High cholesterol    Rx / DC Orders ED Discharge Orders         Ordered    atorvastatin (LIPITOR) 10 MG tablet  Daily        09/14/19 2056    aspirin 81 MG chewable tablet  Daily        09/14/19 2056           Discharge Instructions     Your imaging was reassuring today. Your  symptoms may have been related to a complex migraine vs a TIA (also known as a mini stroke). Your cholesterol was elevated today and therefore we have started you on medication for this - please take as prescribed.   Follow up with your PCP as well as [facility] regarding your ED visit. Your PCP or the neurologist can schedule an outpatient cardiac ECHO (ultrasound of your heart) to ensure that it is healthy.   Return to the ED for any worsening symptoms        Tanda Rockers, Cordelia Poche 09/14/19 2103    Margarita Grizzle, MD 09/14/19 [phone]

## 2019-09-15 ENCOUNTER — Telehealth: Payer: Self-pay

## 2019-09-15 DIAGNOSIS — G459 Transient cerebral ischemic attack, unspecified: Secondary | ICD-10-CM

## 2019-09-15 NOTE — Telephone Encounter (Signed)
Message left on clinical intake voicemail:   Please return call to discuss some concerns that need to be addressed prior to pending appointment for ER follow-up.  I returned call to patient, patient states she had an incident yesterday that resulted in her going to the ER via ambulance. Patient would really like to see Dr.Reed today yet the earliest she can be seen is 09/29/2019. Patient only wants to see Dr.Reed. Patient would like advice on the 3 concerns below  1.) ER physician recommended Lipitor and patient would like for Dr.Reed to confirm that she should start this medication.  2.) Patient would like to know if Dr.Reed will place referral to follow-up with [facility] as recommended on ER AVS. Patient states she has never been seen there before.  3.) Patient also questions the recommendation for an Echocardiogram. Patient would like to know who will order that for her?

## 2019-09-15 NOTE — Telephone Encounter (Signed)
I had seen her ED visit which definitely surprised me. 1)  I agree with the lipitor for cholesterol. 2)  I just placed the referral to [facility] for possible TIA vs complex migraine. 3) I also ordered the echocardiogram for her to make sure she doesn't have any openings between the two sides of the heart that could cause a clot to pass or any enlargement of the upper part of her heart to make [place] think she was having an abnormal rhythm.

## 2019-09-15 NOTE — Telephone Encounter (Signed)
Discussed responses with patient. Patient was pleasantly surprised to get such a quick through response. Patient appeared satisfied with care plan and advised to call if she needs anything prior to her pending appointment with Dr.Reed.

## 2019-09-29 ENCOUNTER — Other Ambulatory Visit: Payer: Self-pay

## 2019-09-29 ENCOUNTER — Encounter: Payer: Self-pay | Admitting: Internal Medicine

## 2019-09-29 ENCOUNTER — Ambulatory Visit (INDEPENDENT_AMBULATORY_CARE_PROVIDER_SITE_OTHER): Payer: PRIVATE HEALTH INSURANCE | Admitting: Internal Medicine

## 2019-09-29 VITALS — BP 128/78 | HR 67 | Temp 97.5°F | Ht 67.0 in | Wt 160.4 lb

## 2019-09-29 DIAGNOSIS — Z23 Encounter for immunization: Secondary | ICD-10-CM

## 2019-09-29 DIAGNOSIS — Z1211 Encounter for screening for malignant neoplasm of colon: Secondary | ICD-10-CM

## 2019-09-29 DIAGNOSIS — G459 Transient cerebral ischemic attack, unspecified: Secondary | ICD-10-CM | POA: Diagnosis not present

## 2019-09-29 DIAGNOSIS — E785 Hyperlipidemia, unspecified: Secondary | ICD-10-CM

## 2019-09-29 MED ORDER — ROSUVASTATIN CALCIUM 5 MG PO TABS: 5.0000 mg | ORAL_TABLET | Freq: Every day | ORAL | 3 refills | Status: DC

## 2019-09-29 NOTE — Patient Instructions (Addendum)
Continue to work on a low cholesterol diet and your exercise program. Try crestor instead of lipitor. We'll recheck your cholesterol in 6 weeks.   Keep me posted if you have any problems. Also be sure to have Dr. Donnetta Hail office send me the cologuard results.   Fat and Cholesterol Restricted Eating Plan Eating a diet that limits fat and cholesterol may help lower your risk for heart disease and other conditions. Your body needs fat and cholesterol for basic functions, but eating too much of these things can be harmful to your health. Your health care provider may order lab tests to check your blood fat (lipid) and cholesterol levels. This helps your health care provider understand your risk for certain conditions and whether you need to make diet changes. Work with your health care provider or dietitian to make an eating plan that is right for you. What are tips for following this plan? General guidelines   If you are overweight, work with your health care provider to lose weight safely. Losing just 5-10% of your body weight can improve your overall health and help prevent diseases such as diabetes and heart disease.  Avoid: ? Foods with added sugar. ? Fried foods. ? Foods that contain partially hydrogenated oils, including stick margarine, some tub margarines, cookies, crackers, and other baked goods.  Limit alcohol intake to no more than 1 drink a day for nonpregnant women and 2 drinks a day for men. One drink equals 12 oz of beer, 5 oz of wine, or 1 oz of hard liquor. Reading food labels  Check food labels for: ? Trans fats, partially hydrogenated oils, or high amounts of saturated fat. Avoid foods that contain saturated fat and trans fat. ? The amount of cholesterol in each serving. Try to eat no more than 200 mg of cholesterol each day. ? The amount of fiber in each serving. Try to eat at least 20-30 g of fiber each day.  Choose foods with healthy fats, such as: ? Monounsaturated  and polyunsaturated fats. These include olive and canola oil, flaxseeds, walnuts, almonds, and seeds. ? Omega-3 fats. These are found in foods such as salmon, mackerel, sardines, tuna, flaxseed oil, and ground flaxseeds.  Choose grain products that have whole grains. Look for the word "whole" as the first word in the ingredient list. Cooking  Cook foods using methods other than frying. Baking, boiling, grilling, and broiling are some healthy options.  Eat more home-cooked food and less restaurant, buffet, and fast food.  Avoid cooking using saturated fats. ? Animal sources of saturated fats include meats, butter, and cream. ? Plant sources of saturated fats include palm oil, palm kernel oil, and coconut oil. Meal planning   At meals, imagine dividing your plate into fourths: ? Fill one-half of your plate with vegetables and green salads. ? Fill one-fourth of your plate with whole grains. ? Fill one-fourth of your plate with lean protein foods.  Eat fish that is high in omega-3 fats at least two times a week.  Eat more foods that contain fiber, such as whole grains, beans, apples, broccoli, carrots, peas, and barley. These foods help promote healthy cholesterol levels in the blood. Recommended foods Grains  Whole grains, such as whole wheat or whole grain breads, crackers, cereals, and pasta. Unsweetened oatmeal, bulgur, barley, quinoa, or brown rice. Corn or whole wheat flour tortillas. Vegetables  Fresh or frozen vegetables (raw, steamed, roasted, or grilled). Green salads. Fruits  All fresh, canned (in natural juice), or  frozen fruits. Meats and other protein foods  Ground beef (85% or leaner), grass-fed beef, or beef trimmed of fat. Skinless chicken or [place]. Ground chicken or [place]. Pork trimmed of fat. All fish and seafood. Egg whites. Dried beans, peas, or lentils. Unsalted nuts or seeds. Unsalted canned beans. Natural nut butters without added sugar and  oil. Dairy  Low-fat or nonfat dairy products, such as skim or 1% milk, 2% or reduced-fat cheeses, low-fat and fat-free ricotta or cottage cheese, or plain low-fat and nonfat yogurt. Fats and oils  Tub margarine without trans fats. Light or reduced-fat mayonnaise and salad dressings. Avocado. Olive, canola, sesame, or safflower oils. The items listed above may not be a complete list of recommended foods or beverages. Contact your dietitian for more options. Foods to avoid Grains  White bread. White pasta. White rice. Cornbread. Bagels, pastries, and croissants. Crackers and snack foods that contain trans fat and hydrogenated oils. Vegetables  Vegetables cooked in cheese, cream, or butter sauce. Fried vegetables. Fruits  Canned fruit in heavy syrup. Fruit in cream or butter sauce. Fried fruit. Meats and other protein foods  Fatty cuts of meat. Ribs, chicken wings, bacon, sausage, bologna, salami, chitterlings, fatback, hot dogs, bratwurst, and packaged lunch meats. Liver and organ meats. Whole eggs and egg yolks. Chicken and [place] with skin. Fried meat. Dairy  Whole or 2% milk, cream, half-and-half, and cream cheese. Whole milk cheeses. Whole-fat or sweetened yogurt. Full-fat cheeses. Nondairy creamers and whipped toppings. Processed cheese, cheese spreads, and cheese curds. Beverages  Alcohol. Sugar-sweetened drinks such as sodas, lemonade, and fruit drinks. Fats and oils  Butter, stick margarine, lard, shortening, ghee, or bacon fat. Coconut, palm kernel, and palm oils. Sweets and desserts  Corn syrup, sugars, honey, and molasses. Candy. Jam and jelly. Syrup. Sweetened cereals. Cookies, pies, cakes, donuts, muffins, and ice cream. The items listed above may not be a complete list of foods and beverages to avoid. Contact your dietitian for more information. Summary  Your body needs fat and cholesterol for basic functions. However, eating too much of these things can be harmful to  your health.  Work with your health care provider and dietitian to follow a diet low in fat and cholesterol. Doing this may help lower your risk for heart disease and other conditions.  Choose healthy fats, such as monounsaturated and polyunsaturated fats, and foods high in omega-3 fatty acids.  Eat fiber-rich foods, such as whole grains, beans, peas, fruits, and vegetables.  Limit or avoid alcohol, fried foods, and foods high in saturated fats, partially hydrogenated oils, and sugar. This information is not intended to replace advice given to you by your health care provider. Make sure you discuss any questions you have with your health care provider. Document Revised: 12/12/2016 Document Reviewed: 09/16/2016 Elsevier Patient Education  2020 [employer].

## 2019-09-29 NOTE — Progress Notes (Signed)
Location:  [facility] clinic Provider: Kyrin Garn L. Renato Gails, D.O., C.M.D.  Goals of Care:  Advanced Directives 09/29/2019  Does Patient Have a Medical Advance Directive? Yes  Type of Advance Directive -  Does patient want to make changes to medical advance directive? No - Guardian declined  Copy of Healthcare Power of Attorney in Chart? -  Would patient like information on creating a medical advance directive? -     Chief Complaint  Patient presents with  . Acute Visit    follow  up from hospital on 09/14/2019  . Health Maintenance    influenza, colonoscopy     HPI: Patient is a 64 y.o. female seen today for hospital follow-up s/p 4 hr ED visit for possible TIA 09/14/19.  She'd been in her office all day, had been talking to her dad and sister.  It was late afternoon, she was typing an emial and her pinky finger could not hit the right keys.  She went into the bathroom and thought she looked ok.  Asked Will, her son, a question.  Thought she slurred her words.  Got her phone out and looked at signs of stroke.  He asked if she felt ok.  She sat down.  He called Amil Amen (other child) and they decided all together to call 911.  BP was 170 over stg when they arrived.  By then she was starting to feel better.  They had her smile and her son was not sure if she was lopsided or not.  EMS folks decided she should go to the ED.  Had CT and MRI.  Ativan received.  She holds all of her tension in her neck.  She thought maybe she'd pushed herself and had some neck tightness.  She still had that afterward.  Wondered about a pinched nerve.  Then they suggested a complex migraine.  She never had a headache.    Her cholesterol remains elevated.  It's been about the same for years. She was told to start on the statin--atorvastatin 20mg --she's dropped down to 10mg .  She asked her friends about it.  She now has three middle toes of her right foot that are numb.  she was having calf pain, too.  Notes that began after her  statin therapy.  Says she does not feel 100% herself.  Is a little foggy.    She rarely eats red meat.  She walks or works out M-F and does something on the weekends.  She had been changing her diet already prior to this.    She is taking her baby aspirin.    Past Medical History:  Diagnosis Date  . Anxiety state, unspecified   . Contusion of buttock   . Hematoma 10/06/10   buttock   . Symptomatic menopausal or female climacteric states   . Thrombophlebitis leg 1990  . Varicose vein of leg   . Varicose veins     Past Surgical History:  Procedure Laterality Date  . CHOLECYSTECTOMY  December 1990   Dr.Matt 10/08/10  . COLONOSCOPY  09/14/2008   Diverticulosis, Dr.Medoff   . ENDOMETRIAL ABLATION  2006   Dr.Ron Neal   . VARICOSE VEIN SURGERY Left 10/12/2013   2007 -[facility]    No Known Allergies  Outpatient Encounter Medications as of 09/29/2019  Medication Sig  . aspirin 81 MG chewable tablet Chew 1 tablet (81 mg total) by mouth daily.  Cyril Mourning atorvastatin (LIPITOR) 10 MG tablet Take 2 tablets (20 mg total) by mouth  daily.   No facility-administered encounter medications on file as of 09/29/2019.    Review of Systems:  Review of Systems  Constitutional: Negative for chills, fever and malaise/fatigue.  HENT: Negative for congestion and sore throat.   Eyes: Negative for blurred vision.  Respiratory: Negative for cough and shortness of breath.   Cardiovascular: Negative for chest pain, palpitations and leg swelling.  Gastrointestinal: Negative for abdominal pain, blood in stool, constipation, diarrhea and melena.  Genitourinary: Negative for dysuria.  Musculoskeletal: Negative for falls and joint pain.  Skin: Negative for itching and rash.  Neurological: Positive for tingling and sensory change. Negative for loss of consciousness.  Psychiatric/Behavioral: Negative for memory loss.    Health Maintenance  Topic Date Due  . DEXA SCAN  06/12/2018  . COLONOSCOPY   09/15/2018  . Hepatitis C Screening  02/03/2020 (Originally Sep 03, 1955)  . HIV Screening  02/03/2020 (Originally 09/23/1970)  . MAMMOGRAM  03/31/2021  . PAP SMEAR-Modifier  11/14/2021  . TETANUS/TDAP  11/24/2021  . INFLUENZA VACCINE  Completed  . COVID-19 Vaccine  Completed    Physical Exam: Vitals:   09/29/19 0810  BP: 138/82  Pulse: 67  Temp: (!) 97.5 F (36.4 C)  TempSrc: Temporal  SpO2: 99%  Weight: 160 lb 6.4 oz (72.8 kg)  Height: 5\' 7"  (1.702 m)   Body mass index is 25.12 kg/m. Physical Exam Vitals reviewed.  Constitutional:      Appearance: Normal appearance.  HENT:     Head: Normocephalic and atraumatic.  Eyes:     Extraocular Movements: Extraocular movements intact.     Pupils: Pupils are equal, round, and reactive to light.  Cardiovascular:     Rate and Rhythm: Normal rate and regular rhythm.     Pulses: Normal pulses.     Heart sounds: Normal heart sounds.  Pulmonary:     Effort: Pulmonary effort is normal.     Breath sounds: Normal breath sounds. No wheezing, rhonchi or rales.  Musculoskeletal:        General: Normal range of motion.     Right lower leg: No edema.     Left lower leg: No edema.  Neurological:     General: No focal deficit present.     Mental Status: She is alert and oriented to person, place, and time.     Cranial Nerves: No cranial nerve deficit.     Sensory: Sensory deficit present.     Motor: No weakness.     Coordination: Coordination normal.     Gait: Gait normal.     Deep Tendon Reflexes: Reflexes normal.  Psychiatric:        Mood and Affect: Mood normal.        Behavior: Behavior normal.     Labs reviewed: Basic Metabolic Panel: Recent Labs    02/23/19 0825 09/14/19 1737 09/14/19 1834  NA 142 139 139  K 4.5 3.4* 3.5  CL 104 102 102  CO2 29 23  --   GLUCOSE 95 117* 107*  BUN 13 13 14   CREATININE 0.76 0.77 0.70  CALCIUM 9.8 9.5  --    Liver Function Tests: Recent Labs    02/23/19 0825 09/14/19 1737  AST 20  20  ALT 26 24  ALKPHOS  --  60  BILITOT 0.4 0.7  PROT 6.9 6.7  ALBUMIN  --  4.3   No results for input(s): LIPASE, AMYLASE in the last 8760 hours. No results for input(s): AMMONIA in the last 8760 hours. CBC: Recent  Labs    02/23/19 0825 09/14/19 1737 09/14/19 1834  WBC 5.7 6.5  --   NEUTROABS 3,044  --   --   HGB 13.4 12.4 11.9*  HCT 39.0 37.3 35.0*  MCV 89.0 90.5  --   PLT 240 238  --    Lipid Panel: Recent Labs    02/23/19 0825 09/14/19 1744  CHOL 229* 237*  HDL 58 50  LDLCALC 134* 139*  TRIG 230* 238*  CHOLHDL 3.9 4.7   Lab Results  Component Value Date   HGBA1C 4.9 09/14/2019    Procedures since last visit: CT HEAD WO CONTRAST  Result Date: 09/14/2019 CLINICAL DATA:  TIA EXAM: CT HEAD WITHOUT CONTRAST TECHNIQUE: Contiguous axial images were obtained from the base of the skull through the vertex without intravenous contrast. COMPARISON:  MRI 09/14/2019 FINDINGS: Brain: No acute territorial infarction, hemorrhage or intracranial mass. The ventricles are nonenlarged. Vascular: No hyperdense vessel or unexpected calcification. Skull: Normal. Negative for fracture or focal lesion. Sinuses/Orbits: No acute finding. Other: None IMPRESSION: Negative non contrasted CT appearance of the brain. Electronically Signed   By: Jasmine PangKim  Fujinaga M.D.   On: 09/14/2019 19:58   MR ANGIO HEAD WO CONTRAST  Result Date: 09/14/2019 CLINICAL DATA:  Slurred speech and dizziness EXAM: MRI HEAD WITHOUT CONTRAST MRA HEAD WITHOUT CONTRAST MRA NECK WITHOUT CONTRAST TECHNIQUE: Multiplanar, multiecho pulse sequences of the brain and surrounding structures were obtained without intravenous contrast. Angiographic images of the Circle of Willis were obtained using MRA technique without intravenous contrast. Angiographic images of the neck were obtained using MRA technique without intravenous contrast. Carotid stenosis measurements (when applicable) are obtained utilizing NASCET criteria, using the distal  internal carotid diameter as the denominator. COMPARISON:  None. FINDINGS: MRI HEAD FINDINGS Brain: No acute infarct, acute hemorrhage or extra-axial collection. Normal white matter signal. Normal volume of CSF spaces. No chronic microhemorrhage. Normal midline structures. Vascular: Normal flow voids. Skull and upper cervical spine: Normal marrow signal. Sinuses/Orbits: Negative. Other: None. MRA HEAD FINDINGS POSTERIOR CIRCULATION: --Vertebral arteries: Normal V4 segments. --Inferior cerebellar arteries: Normal. --Basilar artery: Normal. --Superior cerebellar arteries: Normal. --Posterior cerebral arteries: Normal. ANTERIOR CIRCULATION: --Intracranial internal carotid arteries: Normal. --Anterior cerebral arteries (ACA): Normal. Both A1 segments are present. Patent anterior communicating artery (a-comm). --Middle cerebral arteries (MCA): Normal. MRA NECK FINDINGS Normal time-of-flight imaging of the carotid and vertebral arteries. The vertebral system is left dominant. No carotid bifurcation stenosis. IMPRESSION: 1. Normal MRI of the brain. 2. Normal MRA of the head and neck. Electronically Signed   By: Deatra RobinsonKevin  Herman M.D.   On: 09/14/2019 19:31   MR MRA NECK WO CONTRAST  Result Date: 09/14/2019 CLINICAL DATA:  Slurred speech and dizziness EXAM: MRI HEAD WITHOUT CONTRAST MRA HEAD WITHOUT CONTRAST MRA NECK WITHOUT CONTRAST TECHNIQUE: Multiplanar, multiecho pulse sequences of the brain and surrounding structures were obtained without intravenous contrast. Angiographic images of the Circle of Willis were obtained using MRA technique without intravenous contrast. Angiographic images of the neck were obtained using MRA technique without intravenous contrast. Carotid stenosis measurements (when applicable) are obtained utilizing NASCET criteria, using the distal internal carotid diameter as the denominator. COMPARISON:  None. FINDINGS: MRI HEAD FINDINGS Brain: No acute infarct, acute hemorrhage or extra-axial  collection. Normal white matter signal. Normal volume of CSF spaces. No chronic microhemorrhage. Normal midline structures. Vascular: Normal flow voids. Skull and upper cervical spine: Normal marrow signal. Sinuses/Orbits: Negative. Other: None. MRA HEAD FINDINGS POSTERIOR CIRCULATION: --Vertebral arteries: Normal V4 segments. --Inferior cerebellar arteries:  Normal. --Basilar artery: Normal. --Superior cerebellar arteries: Normal. --Posterior cerebral arteries: Normal. ANTERIOR CIRCULATION: --Intracranial internal carotid arteries: Normal. --Anterior cerebral arteries (ACA): Normal. Both A1 segments are present. Patent anterior communicating artery (a-comm). --Middle cerebral arteries (MCA): Normal. MRA NECK FINDINGS Normal time-of-flight imaging of the carotid and vertebral arteries. The vertebral system is left dominant. No carotid bifurcation stenosis. IMPRESSION: 1. Normal MRI of the brain. 2. Normal MRA of the head and neck. Electronically Signed   By: Deatra Robinson M.D.   On: 09/14/2019 19:31   MR BRAIN WO CONTRAST  Result Date: 09/14/2019 CLINICAL DATA:  Slurred speech and dizziness EXAM: MRI HEAD WITHOUT CONTRAST MRA HEAD WITHOUT CONTRAST MRA NECK WITHOUT CONTRAST TECHNIQUE: Multiplanar, multiecho pulse sequences of the brain and surrounding structures were obtained without intravenous contrast. Angiographic images of the Circle of Willis were obtained using MRA technique without intravenous contrast. Angiographic images of the neck were obtained using MRA technique without intravenous contrast. Carotid stenosis measurements (when applicable) are obtained utilizing NASCET criteria, using the distal internal carotid diameter as the denominator. COMPARISON:  None. FINDINGS: MRI HEAD FINDINGS Brain: No acute infarct, acute hemorrhage or extra-axial collection. Normal white matter signal. Normal volume of CSF spaces. No chronic microhemorrhage. Normal midline structures. Vascular: Normal flow voids. Skull and  upper cervical spine: Normal marrow signal. Sinuses/Orbits: Negative. Other: None. MRA HEAD FINDINGS POSTERIOR CIRCULATION: --Vertebral arteries: Normal V4 segments. --Inferior cerebellar arteries: Normal. --Basilar artery: Normal. --Superior cerebellar arteries: Normal. --Posterior cerebral arteries: Normal. ANTERIOR CIRCULATION: --Intracranial internal carotid arteries: Normal. --Anterior cerebral arteries (ACA): Normal. Both A1 segments are present. Patent anterior communicating artery (a-comm). --Middle cerebral arteries (MCA): Normal. MRA NECK FINDINGS Normal time-of-flight imaging of the carotid and vertebral arteries. The vertebral system is left dominant. No carotid bifurcation stenosis. IMPRESSION: 1. Normal MRI of the brain. 2. Normal MRA of the head and neck. Electronically Signed   By: Deatra Robinson M.D.   On: 09/14/2019 19:31    Assessment/Plan 1. Dyslipidemia - due to odd numbness of three right middle toes and left calf cramping she had, we will try crestor for 6 wks then recheck flp - rosuvastatin (CRESTOR) 5 MG tablet; Take 1 tablet (5 mg total) by mouth at bedtime.  Dispense: 30 tablet; Refill: 3 - Lipid panel; Future  2. TIA (transient ischemic attack) - cont baby asa, change to crestor and cont diet and exercise - rosuvastatin (CRESTOR) 5 MG tablet; Take 1 tablet (5 mg total) by mouth at bedtime.  Dispense: 30 tablet; Refill: 3 - Lipid panel; Future  3. Need for influenza vaccination - Flu Vaccine QUAD 6+ mos PF IM (Fluarix Quad PF)--regular given  4. Colon cancer screening - is getting cologuard thru her gyn and we will need a report  Labs/tests ordered:   Lab Orders     Lipid panel in 6 wks  Next appt:  02/16/2020 with fasting labs before already planned also  Glyn Zendejas L. Maleeah Crossman, D.O. Geriatrics [facility] [address], [place] [address] Cell Phone (Mon-Fri 8am-5pm):  [phone] On Call:  [phone] & follow prompts after  5pm & weekends Office Phone:  [phone] Office Fax:  [phone]

## 2019-10-03 ENCOUNTER — Ambulatory Visit (HOSPITAL_COMMUNITY): Payer: PRIVATE HEALTH INSURANCE | Attending: Cardiology

## 2019-10-03 ENCOUNTER — Other Ambulatory Visit: Payer: Self-pay

## 2019-10-03 DIAGNOSIS — G459 Transient cerebral ischemic attack, unspecified: Secondary | ICD-10-CM

## 2019-10-03 LAB — ECHOCARDIOGRAM COMPLETE BUBBLE STUDY
Area-P 1/2: 4.6 cm2
S' Lateral: 2.8 cm

## 2019-10-03 MED ORDER — SODIUM CHLORIDE 0.9% FLUSH
10.0000 mL | INTRAVENOUS | Status: AC | PRN
  Administered 2019-10-03: 10 mL via INTRAVENOUS

## 2019-10-04 NOTE — Progress Notes (Signed)
Echocardiogram was overall normal.  She did not have an opening between the sides of her heart that would make [place] concerned that she had thrown a blood clot causing her transient facial droop, speech changes she had.   Await neurology input.  She uses mychart.

## 2019-11-10 ENCOUNTER — Other Ambulatory Visit: Payer: Self-pay

## 2019-11-10 ENCOUNTER — Other Ambulatory Visit: Payer: PRIVATE HEALTH INSURANCE

## 2019-11-10 DIAGNOSIS — G459 Transient cerebral ischemic attack, unspecified: Secondary | ICD-10-CM

## 2019-11-10 DIAGNOSIS — E785 Hyperlipidemia, unspecified: Secondary | ICD-10-CM

## 2019-11-10 LAB — LIPID PANEL
Cholesterol: 143 mg/dL (ref <200)
HDL: 52 mg/dL (ref >=50)
LDL Cholesterol (Calc): 68 mg/dL (calc)
Non-HDL Cholesterol (Calc): 91 mg/dL (calc) (ref <130)
Total CHOL/HDL Ratio: 2.8 (calc) (ref <5.0)
Triglycerides: 150 mg/dL — ABNORMAL HIGH (ref <150)

## 2019-11-17 ENCOUNTER — Ambulatory Visit: Payer: PRIVATE HEALTH INSURANCE | Admitting: Neurology

## 2019-11-17 ENCOUNTER — Encounter: Payer: Self-pay | Admitting: Neurology

## 2019-11-17 VITALS — BP 150/76 | HR 84 | Ht 67.0 in | Wt 165.5 lb

## 2019-11-17 DIAGNOSIS — R4781 Slurred speech: Secondary | ICD-10-CM

## 2019-11-17 DIAGNOSIS — G459 Transient cerebral ischemic attack, unspecified: Secondary | ICD-10-CM

## 2019-11-17 NOTE — Patient Instructions (Signed)
I had a long d/w patient about her recent TIA, risk for recurrent stroke/TIAs, personally independently reviewed imaging studies and stroke evaluation results and answered questions.Start Aspirin 81 mg daily  for secondary stroke prevention and maintain strict control of hypertension with blood pressure goal below 130/90, diabetes with hemoglobin A1c goal below 6.5% and lipids with LDL cholesterol goal below 70 mg/dL. I also advised the patient to eat a healthy diet with plenty of whole grains, cereals, fruits and vegetables, exercise regularly and maintain ideal body weight I advised her to eliminate salt from her diet and to keep a log of her blood pressure readings and if they are consistently high she may need to go on a blood pressure medicine..Followup in the future with me in  6 months or call earlier if needed.  Stroke Prevention Some medical conditions and behaviors are associated with a higher chance of having a stroke. You can help prevent a stroke by making nutrition, lifestyle, and other changes, including managing any medical conditions you may have. What nutrition changes can be made?   Eat healthy foods. You can do this by: ? Choosing foods high in fiber, such as fresh fruits and vegetables and whole grains. ? Eating at least 5 or more servings of fruits and vegetables a day. Try to fill half of your plate at each meal with fruits and vegetables. ? Choosing lean protein foods, such as lean cuts of meat, poultry without skin, fish, tofu, beans, and nuts. ? Eating low-fat dairy products. ? Avoiding foods that are high in salt (sodium). This can help lower blood pressure. ? Avoiding foods that have saturated fat, trans fat, and cholesterol. This can help prevent high cholesterol. ? Avoiding processed and premade foods.  Follow your health care provider's specific guidelines for losing weight, controlling high blood pressure (hypertension), lowering high cholesterol, and managing  diabetes. These may include: ? Reducing your daily calorie intake. ? Limiting your daily sodium intake to 1,500 milligrams (mg). ? Using only healthy fats for cooking, such as olive oil, canola oil, or sunflower oil. ? Counting your daily carbohydrate intake. What lifestyle changes can be made?  Maintain a healthy weight. Talk to your health care provider about your ideal weight.  Get at least 30 minutes of moderate physical activity at least 5 days a week. Moderate activity includes brisk walking, biking, and swimming.  Do not use any products that contain nicotine or tobacco, such as cigarettes and e-cigarettes. If you need help quitting, ask your health care provider. It may also be helpful to avoid exposure to secondhand smoke.  Limit alcohol intake to no more than 1 drink a day for nonpregnant women and 2 drinks a day for men. One drink equals 12 oz of beer, 5 oz of wine, or 1 oz of hard liquor.  Stop any illegal drug use.  Avoid taking birth control pills. Talk to your health care provider about the risks of taking birth control pills if: ? You are over 72 years old. ? You smoke. ? You get migraines. ? You have ever had a blood clot. What other changes can be made?  Manage your cholesterol levels. ? Eating a healthy diet is important for preventing high cholesterol. If cholesterol cannot be managed through diet alone, you may also need to take medicines. ? Take any prescribed medicines to control your cholesterol as told by your health care provider.  Manage your diabetes. ? Eating a healthy diet and exercising regularly are important parts  of managing your blood sugar. If your blood sugar cannot be managed through diet and exercise, you may need to take medicines. ? Take any prescribed medicines to control your diabetes as told by your health care provider.  Control your hypertension. ? To reduce your risk of stroke, try to keep your blood pressure below 130/80. ? Eating a  healthy diet and exercising regularly are an important part of controlling your blood pressure. If your blood pressure cannot be managed through diet and exercise, you may need to take medicines. ? Take any prescribed medicines to control hypertension as told by your health care provider. ? Ask your health care provider if you should monitor your blood pressure at home. ? Have your blood pressure checked every year, even if your blood pressure is normal. Blood pressure increases with age and some medical conditions.  Get evaluated for sleep disorders (sleep apnea). Talk to your health care provider about getting a sleep evaluation if you snore a lot or have excessive sleepiness.  Take over-the-counter and prescription medicines only as told by your health care provider. Aspirin or blood thinners (antiplatelets or anticoagulants) may be recommended to reduce your risk of forming blood clots that can lead to stroke.  Make sure that any other medical conditions you have, such as atrial fibrillation or atherosclerosis, are managed. What are the warning signs of a stroke? The warning signs of a stroke can be easily remembered as BEFAST.  B is for balance. Signs include: ? Dizziness. ? Loss of balance or coordination. ? Sudden trouble walking.  E is for eyes. Signs include: ? A sudden change in vision. ? Trouble seeing.  F is for face. Signs include: ? Sudden weakness or numbness of the face. ? The face or eyelid drooping to one side.  A is for arms. Signs include: ? Sudden weakness or numbness of the arm, usually on one side of the body.  S is for speech. Signs include: ? Trouble speaking (aphasia). ? Trouble understanding.  T is for time. ? These symptoms may represent a serious problem that is an emergency. Do not wait to see if the symptoms will go away. Get medical help right away. Call your local emergency services (911 in the [place] Do not drive yourself to the hospital.  Other  signs of stroke may include: ? A sudden, severe headache with no known cause. ? Nausea or vomiting. ? Seizure. Where to find more information For more information, visit:  American Stroke Association: www.strokeassociation.org  National Stroke Association: www.stroke.org Summary  You can prevent a stroke by eating healthy, exercising, not smoking, limiting alcohol intake, and managing any medical conditions you may have.  Do not use any products that contain nicotine or tobacco, such as cigarettes and e-cigarettes. If you need help quitting, ask your health care provider. It may also be helpful to avoid exposure to secondhand smoke.  Remember BEFAST for warning signs of stroke. Get help right away if you or a loved one has any of these signs. This information is not intended to replace advice given to you by your health care provider. Make sure you discuss any questions you have with your health care provider. Document Revised: 12/12/2016 Document Reviewed: 02/05/2016 Elsevier Patient Education  2020 [employer].

## 2019-11-17 NOTE — Progress Notes (Signed)
[facility] [address] [place]. [place] [address] [phone]       OFFICE CONSULT NOTE  Judith Oconnor Date of Birth:  1955/04/12 Medical Record Number:  [id number]   Referring MD: Bufford Spikes, DO Reason for Referral: TIA  HPI: [name] is a 64 year old pleasant Caucasian lady seen today for initial office consultation visit for speech difficulty.  History is obtained from the patient and review of referral notes and electronic medical records and I personally reviewed available imaging films in PACS.  She has past medical history of anxiety and thrombophlebitis of the legs only.  She had an episode on 09/14/2019 when she was sitting at home her office stable typing on her computer when she noticed she was not typing well and making mistakes.  She got up and she spoke to her son she noticed that she had trouble speaking and the son mentioned that her words were slurred.  There is no obvious facial droop or headache, blurred vision, diplopia, vertigo or obvious extremity weakness or numbness.  When EMT arrived and noticed her blood pressure was elevated in the 170 systolic range.  She was seen at [facility] emergency room where she had an MRI scan of the brain done which showed only few nonspecific white matter hyperintensities with no acute stroke.  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  Patient subsequently had an echocardiogram as an outpatient on 10/03/2019 which showed ejection fraction of 60 to 65% without cardiac source of embolism.  She has not yet started aspirin which she was she was advised to take.  She has changed Lipitor which she was advised to Crestor 5 mg daily which she seems to be tolerating well.  She does have family history of stroke in her maternal grandmother.  Her LDL cholesterol on  09/14/2019 was 138 mg percent and when repeated on 11/10/2019 it has come down to 68 mg percent.  Her hemoglobin A1c was 4.9.  Patient denies any history of palpitations, syncope, cardiac arrhythmias or history of atrial fibrillation.  She states she is doing well today and has no complaints.  She is working full-time in [employer].  ROS:   14 system review of systems is positive for slurred speech, speech difficulty, right hand incoordination and all other systems negative  PMH:  Past Medical History:  Diagnosis Date  . Anxiety state, unspecified   . Contusion of buttock   . Hematoma 10/06/10   buttock   . Stroke (HCC) 09/14/2019   possible TIA  . Symptomatic menopausal or female climacteric states   . Thrombophlebitis leg 1990  . Varicose vein of leg   . Varicose veins     Social History:  Social History   Socioeconomic History  . Marital status: Widowed    Spouse name: Not on file  . Number of children: Not on file  . Years of education: Not on file  . Highest education level: Not on file  Occupational History  . Occupation: Full Time  Tobacco Use  . Smoking status: Never Smoker  . Smokeless tobacco: Never Used  Substance and Sexual Activity  . Alcohol use: Yes    Alcohol/week: 6.0 - 8.0 standard drinks    Types: 6 - 8 Glasses of wine per week  . Drug use: No  . Sexual activity:  Not on file  Other Topics Concern  . Not on file  Social History Narrative   Lives with BF   Right Handed   Drinks 1 cups caffeine daily   Social Determinants of Health   Financial Resource Strain:   . Difficulty of Paying Living Expenses: Not on file  Food Insecurity:   . Worried About Programme researcher, broadcasting/film/video in the Last Year: Not on file  . Ran Out of Food in the Last Year: Not on file  Transportation Needs:   . Lack of Transportation (Medical): Not on file  . Lack of Transportation (Non-Medical): Not on file  Physical Activity:   . Days of Exercise per Week: Not on file  .  Minutes of Exercise per Session: Not on file  Stress:   . Feeling of Stress : Not on file  Social Connections:   . Frequency of Communication with Friends and Family: Not on file  . Frequency of Social Gatherings with Friends and Family: Not on file  . Attends Religious Services: Not on file  . Active Member of Clubs or Organizations: Not on file  . Attends Banker Meetings: Not on file  . Marital Status: Not on file  Intimate Partner Violence:   . Fear of Current or Ex-Partner: Not on file  . Emotionally Abused: Not on file  . Physically Abused: Not on file  . Sexually Abused: Not on file    Medications:   Current Outpatient Medications on File Prior to Visit  Medication Sig Dispense Refill  . rosuvastatin (CRESTOR) 5 MG tablet Take 1 tablet (5 mg total) by mouth at bedtime. 30 tablet 3   Current Facility-Administered Medications on File Prior to Visit  Medication Dose Route Frequency Provider Last Rate Last Admin  . sodium chloride flush (NS) 0.9 % injection 10 mL  10 mL Intravenous PRN Reed, Tiffany L, DO   10 mL at 10/03/19 1335    Allergies:  No Known Allergies  Physical Exam General: well developed, well nourished pleasant middle-age Caucasian lady, seated, in no evident distress Head: head normocephalic and atraumatic.   Neck: supple with no carotid or supraclavicular bruits Cardiovascular: regular rate and rhythm, no murmurs Musculoskeletal: no deformity Skin:  no rash/petichiae Vascular:  Normal pulses all extremities  Neurologic Exam Mental Status: Awake and fully alert. Oriented to place and time. Recent and remote memory intact. Attention span, concentration and fund of knowledge appropriate. Mood and affect appropriate.  Cranial Nerves: Fundoscopic exam reveals sharp disc margins. Pupils equal, briskly reactive to light. Extraocular movements full without nystagmus. Visual fields full to confrontation. Hearing intact. Facial sensation intact. Face,  tongue, palate moves normally and symmetrically.  Motor: Normal bulk and tone. Normal strength in all tested extremity muscles. Sensory.: intact to touch , pinprick , position and vibratory sensation.  Coordination: Rapid alternating movements normal in all extremities. Finger-to-nose and heel-to-shin performed accurately bilaterally. Gait and Station: Arises from chair without difficulty. Stance is normal. Gait demonstrates normal stride length and balance . Able to heel, toe and tandem walk without difficulty.  Reflexes: 1+ and symmetric. Toes downgoing.   NIHSS 0 Modified Rankin  0   ASSESSMENT:.  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  Vascular risk factors of hyperlipidemia and borderline hypertension.     PLAN: I had a long d/w patient about her recent TIA, risk for recurrent stroke/TIAs,  personally independently reviewed imaging studies and stroke evaluation results and answered questions.Start Aspirin 81 mg daily  for secondary stroke prevention and maintain strict control of hypertension with blood pressure goal below 130/90, diabetes with hemoglobin A1c goal below 6.5% and lipids with LDL cholesterol goal below 70 mg/dL. I also advised the patient to eat a healthy diet with plenty of whole grains, cereals, fruits and vegetables, exercise regularly and maintain ideal body weight I advised her to eliminate salt from her diet and to keep a log of her blood pressure readings and if they are consistently high she may need to go on a blood pressure medicine..Followup in the future with me in  6 months or call earlier if needed.  Greater than 50% time during this 45-minute consultation visit was spent on counseling and coordination of care about her TIA and discussion about prevention and treatment and answering questions Delia Heady, MD Note: This document was  prepared with digital dictation and possible smart phrase technology. Any transcriptional errors that result from this process are unintentional.

## 2020-02-01 ENCOUNTER — Other Ambulatory Visit: Payer: Self-pay | Admitting: Internal Medicine

## 2020-02-01 DIAGNOSIS — E785 Hyperlipidemia, unspecified: Secondary | ICD-10-CM

## 2020-02-01 DIAGNOSIS — G459 Transient cerebral ischemic attack, unspecified: Secondary | ICD-10-CM

## 2020-02-15 ENCOUNTER — Other Ambulatory Visit: Payer: Self-pay

## 2020-02-15 DIAGNOSIS — R002 Palpitations: Secondary | ICD-10-CM

## 2020-02-15 DIAGNOSIS — E785 Hyperlipidemia, unspecified: Secondary | ICD-10-CM

## 2020-02-15 DIAGNOSIS — E2839 Other primary ovarian failure: Secondary | ICD-10-CM

## 2020-02-15 DIAGNOSIS — G459 Transient cerebral ischemic attack, unspecified: Secondary | ICD-10-CM

## 2020-02-15 NOTE — Progress Notes (Signed)
Per Dr. Hebert Soho last note patient did not fast for labs therefore she wanted her to back  On 02/16/2020 for a lipid panel

## 2020-02-16 ENCOUNTER — Other Ambulatory Visit: Payer: Self-pay

## 2020-02-17 LAB — LIPID PANEL
Cholesterol: 128 mg/dL (ref <200)
HDL: 50 mg/dL (ref >=50)
LDL Cholesterol (Calc): 57 mg/dL (calc)
Non-HDL Cholesterol (Calc): 78 mg/dL (calc) (ref <130)
Total CHOL/HDL Ratio: 2.6 (calc) (ref <5.0)
Triglycerides: 128 mg/dL (ref <150)

## 2020-02-17 NOTE — Progress Notes (Signed)
Triglycerides are now at goal.

## 2020-02-20 ENCOUNTER — Other Ambulatory Visit: Payer: Self-pay

## 2020-02-20 ENCOUNTER — Ambulatory Visit (INDEPENDENT_AMBULATORY_CARE_PROVIDER_SITE_OTHER): Payer: 59 | Admitting: Internal Medicine

## 2020-02-20 ENCOUNTER — Encounter: Payer: Self-pay | Admitting: Internal Medicine

## 2020-02-20 VITALS — BP 122/78 | HR 71 | Temp 97.5°F | Ht 67.0 in | Wt 160.8 lb

## 2020-02-20 DIAGNOSIS — Z1211 Encounter for screening for malignant neoplasm of colon: Secondary | ICD-10-CM

## 2020-02-20 DIAGNOSIS — E785 Hyperlipidemia, unspecified: Secondary | ICD-10-CM

## 2020-02-20 DIAGNOSIS — Z0000 Encounter for general adult medical examination without abnormal findings: Secondary | ICD-10-CM

## 2020-02-20 DIAGNOSIS — Z8673 Personal history of transient ischemic attack (TIA), and cerebral infarction without residual deficits: Secondary | ICD-10-CM

## 2020-02-20 NOTE — Patient Instructions (Addendum)
Please arrange your colonoscopy with Dr. Kinnie Scales.  Also, please see Dr. Lloyd Huger for you pap smear and mammogram.

## 2020-02-20 NOTE — Progress Notes (Signed)
Provider:  Gwenith Spitz. Renato Gails, D.O., C.M.D. Location:   [facility]   Place of Service:   clinic  Previous PCP: Kermit Balo, DO Patient Care Team: Kermit Balo, DO as PCP - General (Geriatric Medicine) Pryor Ochoa, MD (Inactive) as Consulting Physician (Vascular Surgery) Freddy Finner, MD as Consulting Physician (Obstetrics and Gynecology) Sharrell Ku, MD as Consulting Physician (Gastroenterology) Luretha Murphy, MD as Consulting Physician (General Surgery) Ernesto Rutherford, MD as Consulting Physician (Ophthalmology)  Extended Emergency Contact Information Primary Emergency Contact: Hewitt [place] Home Phone: [phone] Mobile Phone: [phone] Relation: Significant other Secondary Emergency Contact: Marasia, [place] Mobile Phone: [phone] Relation: Daughter Father: Joline Salt, [place] Home Phone: [phone]  Goals of Care: Advanced Directive information Advanced Directives 09/29/2019  Does Patient Have a Medical Advance Directive? Yes  Type of Advance Directive -  Does patient want to make changes to medical advance directive? No - Guardian declined  Copy of Healthcare Power of Attorney in Chart? -  Would patient like information on creating a medical advance directive? -   Chief Complaint  Patient presents with  . Annual Exam    Annual physical exam     HPI: Patient is a 65 y.o. female seen today for an annual physical exam.  Her mom missed the great grandbaby.  He was born in Nov.  Youngest daughter now expecting.  She and her fiance are getting married later this month and her son will get married June 4th. Her dad is doing ok.    She's had no more TIA spells.  Sees Dr. Pearlean Brownie again in May.  He did think it was a TIA.  Crestor has brought down cholesterol considerably.  She's had no side effects.  She's avoiding salt.  Has a non-salt blend thing.  Tried to lose a little weight for her daughter's  wedding.  Continues on her exercise, but challenging in bad weather.    She changed her insurance this year.  She's going to go back to Dr. Lloyd Huger for her pap.  Had mammo in 04/01/19.    She also has to do colonoscopy--went to Dr. Kinnie Scales before.  Her previous insurance didn't cover it.   Past Medical History:  Diagnosis Date  . Anxiety state, unspecified   . Contusion of buttock   . Hematoma 10/06/10   buttock   . Stroke (HCC) 09/14/2019   possible TIA  . Symptomatic menopausal or female climacteric states   . Thrombophlebitis leg 1990  . Varicose vein of leg   . Varicose veins    Past Surgical History:  Procedure Laterality Date  . CHOLECYSTECTOMY  December 1990   Dr.Matt Daphine Deutscher  . COLONOSCOPY  09/14/2008   Diverticulosis, Dr.Medoff   . ENDOMETRIAL ABLATION  2006   Dr.Ron Neal   . VARICOSE VEIN SURGERY Left 10/12/2013   [facility]    reports that she has never smoked. She has never used smokeless tobacco. She reports current alcohol use of about 6.0 - 8.0 standard drinks of alcohol per week. She reports that she does not use drugs.  Functional Status Survey:    Family History  Problem Relation Age of Onset  . Macular degeneration Mother   . Coronary artery disease Father   . Breast cancer Neg Hx     Health Maintenance  Topic Date Due  . Hepatitis C Screening  Never done  . HIV Screening  Never done  . DEXA SCAN  06/12/2018  . COLONOSCOPY (Pts 45-71yrs Insurance coverage will need to be confirmed)  09/15/2018  . MAMMOGRAM  03/31/2021  . PAP SMEAR-Modifier  11/14/2021  . TETANUS/TDAP  11/24/2021  . INFLUENZA VACCINE  Completed  . COVID-19 Vaccine  Completed    No Known Allergies  Outpatient Encounter Medications as of 02/20/2020  Medication Sig  . rosuvastatin (CRESTOR) 5 MG tablet TAKE 1 TABLET(5 MG) BY MOUTH AT BEDTIME   Facility-Administered Encounter Medications as of 02/20/2020  Medication  . sodium chloride flush (NS) 0.9 % injection 10 mL     Review of Systems  Constitutional: Negative for chills, fever and malaise/fatigue.  HENT: Negative for congestion and sore throat.        Some difficulty with hearing the tv at times  Eyes: Negative for blurred vision.  Respiratory: Negative for cough and shortness of breath.   Cardiovascular: Negative for chest pain, palpitations and leg swelling.  Gastrointestinal: Negative for abdominal pain, blood in stool, constipation, diarrhea and melena.  Genitourinary: Negative for dysuria.  Musculoskeletal: Negative for falls, joint pain and myalgias.  Neurological: Negative for dizziness and loss of consciousness.  Endo/Heme/Allergies: Does not bruise/bleed easily.  Psychiatric/Behavioral: Negative for depression and memory loss. The patient is not nervous/anxious and does not have insomnia.     Vitals:   02/20/20 0850  BP: 122/78  Pulse: 71  Temp: (!) 97.5 F (36.4 C)  SpO2: 99%  Weight: 160 lb 12.8 oz (72.9 kg)  Height: 5\' 7"  (1.702 m)   Body mass index is 25.18 kg/m. Physical Exam Vitals reviewed.  Constitutional:      General: She is not in acute distress.    Appearance: Normal appearance. She is not toxic-appearing.  HENT:     Head: Normocephalic and atraumatic.     Right Ear: Tympanic membrane, ear canal and external ear normal.     Left Ear: Tympanic membrane, ear canal and external ear normal.     Nose: Nose normal.     Mouth/Throat:     Pharynx: Oropharynx is clear.  Eyes:     Extraocular Movements: Extraocular movements intact.     Conjunctiva/sclera: Conjunctivae normal.     Pupils: Pupils are equal, round, and reactive to light.  Cardiovascular:     Rate and Rhythm: Normal rate and regular rhythm.     Pulses: Normal pulses.     Heart sounds: Normal heart sounds.  Pulmonary:     Effort: Pulmonary effort is normal.     Breath sounds: Normal breath sounds. No wheezing, rhonchi or rales.  Abdominal:     General: Bowel sounds are normal. There is no  distension.     Palpations: Abdomen is soft.     Tenderness: There is no abdominal tenderness. There is no guarding or rebound.  Musculoskeletal:        General: Normal range of motion.     Right lower leg: No edema.     Left lower leg: No edema.  Skin:    General: Skin is warm and dry.  Neurological:     General: No focal deficit present.     Mental Status: She is alert and oriented to person, place, and time.     Cranial Nerves: No cranial nerve deficit.     Sensory: No sensory deficit.     Motor: No weakness.     Gait: Gait normal.  Psychiatric:        Mood and Affect: Mood  normal.        Behavior: Behavior normal.        Thought Content: Thought content normal.        Judgment: Judgment normal.     Labs reviewed: Basic Metabolic Panel: Recent Labs    02/23/19 0825 09/14/19 1737 09/14/19 1834  NA 142 139 139  K 4.5 3.4* 3.5  CL 104 102 102  CO2 29 23  --   GLUCOSE 95 117* 107*  BUN 13 13 14   CREATININE 0.76 0.77 0.70  CALCIUM 9.8 9.5  --    Liver Function Tests: Recent Labs    02/23/19 0825 09/14/19 1737  AST 20 20  ALT 26 24  ALKPHOS  --  60  BILITOT 0.4 0.7  PROT 6.9 6.7  ALBUMIN  --  4.3   No results for input(s): LIPASE, AMYLASE in the last 8760 hours. No results for input(s): AMMONIA in the last 8760 hours. CBC: Recent Labs    02/23/19 0825 09/14/19 1737 09/14/19 1834  WBC 5.7 6.5  --   NEUTROABS 3,044  --   --   HGB 13.4 12.4 11.9*  HCT 39.0 37.3 35.0*  MCV 89.0 90.5  --   PLT 240 238  --    Cardiac Enzymes: No results for input(s): CKTOTAL, CKMB, CKMBINDEX, TROPONINI in the last 8760 hours. BNP: Invalid input(s): POCBNP Lab Results  Component Value Date   HGBA1C 4.9 09/14/2019   Lab Results  Component Value Date   TSH 1.430 12/23/2013   No results found for: VITAMINB12 No results found for: FOLATE No results found for: IRON, TIBC, FERRITIN  Assessment/Plan 1. Annual physical exam -exam performed today -has pap and breast  exam, mammogram later  2. Dyslipidemia -cont crestor therapy, tolerating well, also continue diet and exercise  3. Colon cancer screening -she is going to arrange her f/u cscope   4. History of TIA (transient ischemic attack) -no further difficulty -cont crestor -bp at goal  F/u in 1 yr for CPE, fasting labs before  Fred Franzen L. Tahmir Kleckner, D.O. Geriatrics 14/11/2013 [facility] [address], [place] Cell Phone (Mon-Fri 8am-5pm):  [phone] On Call:  [phone] & follow prompts after 5pm & weekends Office Phone:  [phone] Office Fax:  [phone]

## 2020-03-05 ENCOUNTER — Encounter: Payer: Self-pay | Admitting: Internal Medicine

## 2020-05-02 ENCOUNTER — Other Ambulatory Visit: Payer: Self-pay | Admitting: Internal Medicine

## 2020-05-02 DIAGNOSIS — E785 Hyperlipidemia, unspecified: Secondary | ICD-10-CM

## 2020-05-02 DIAGNOSIS — G459 Transient cerebral ischemic attack, unspecified: Secondary | ICD-10-CM

## 2020-05-16 ENCOUNTER — Ambulatory Visit: Payer: PRIVATE HEALTH INSURANCE | Admitting: Neurology

## 2020-05-28 ENCOUNTER — Ambulatory Visit (INDEPENDENT_AMBULATORY_CARE_PROVIDER_SITE_OTHER): Payer: 59 | Admitting: Neurology

## 2020-05-28 ENCOUNTER — Encounter: Payer: Self-pay | Admitting: Neurology

## 2020-05-28 VITALS — BP 141/69 | Ht 67.0 in | Wt 163.8 lb

## 2020-05-28 DIAGNOSIS — Z8673 Personal history of transient ischemic attack (TIA), and cerebral infarction without residual deficits: Secondary | ICD-10-CM | POA: Diagnosis not present

## 2020-05-28 NOTE — Patient Instructions (Signed)
I had a long d/w patient about her remote TIA,stroke, risk for recurrent stroke/TIAs, personally independently reviewed imaging studies and stroke evaluation results and answered questions.Continue aspirin 81 mg daily  for secondary stroke prevention and maintain strict control of hypertension with blood pressure goal below 130/90, diabetes with hemoglobin A1c goal below 6.5% and lipids with LDL cholesterol goal below 70 mg/dL. I also advised the patient to eat a healthy diet with plenty of whole grains, cereals, fruits and vegetables, exercise regularly and maintain ideal body weight Followup in the future with me in 1 year or call earlier if necessary.  Stroke Prevention Some medical conditions and behaviors are associated with a higher chance of having a stroke. You can help prevent a stroke by making nutrition, lifestyle, and other changes, including managing any medical conditions you may have. What nutrition changes can be made?  Eat healthy foods. You can do this by: ? Choosing foods high in fiber, such as fresh fruits and vegetables and whole grains. ? Eating at least 5 or more servings of fruits and vegetables a day. Try to fill half of your plate at each meal with fruits and vegetables. ? Choosing lean protein foods, such as lean cuts of meat, poultry without skin, fish, tofu, beans, and nuts. ? Eating low-fat dairy products. ? Avoiding foods that are high in salt (sodium). This can help lower blood pressure. ? Avoiding foods that have saturated fat, trans fat, and cholesterol. This can help prevent high cholesterol. ? Avoiding processed and premade foods.  Follow your health care provider's specific guidelines for losing weight, controlling high blood pressure (hypertension), lowering high cholesterol, and managing diabetes. These may include: ? Reducing your daily calorie intake. ? Limiting your daily sodium intake to 1,500 milligrams (mg). ? Using only healthy fats for cooking, such as  olive oil, canola oil, or sunflower oil. ? Counting your daily carbohydrate intake.   What lifestyle changes can be made?  Maintain a healthy weight. Talk to your health care provider about your ideal weight.  Get at least 30 minutes of moderate physical activity at least 5 days a week. Moderate activity includes brisk walking, biking, and swimming.  Do not use any products that contain nicotine or tobacco, such as cigarettes and e-cigarettes. If you need help quitting, ask your health care provider. It may also be helpful to avoid exposure to secondhand smoke.  Limit alcohol intake to no more than 1 drink a day for nonpregnant women and 2 drinks a day for men. One drink equals 12 oz of beer, 5 oz of wine, or 1 oz of hard liquor.  Stop any illegal drug use.  Avoid taking birth control pills. Talk to your health care provider about the risks of taking birth control pills if: ? You are over 79 years old. ? You smoke. ? You get migraines. ? You have ever had a blood clot. What other changes can be made?  Manage your cholesterol levels. ? Eating a healthy diet is important for preventing high cholesterol. If cholesterol cannot be managed through diet alone, you may also need to take medicines. ? Take any prescribed medicines to control your cholesterol as told by your health care provider.  Manage your diabetes. ? Eating a healthy diet and exercising regularly are important parts of managing your blood sugar. If your blood sugar cannot be managed through diet and exercise, you may need to take medicines. ? Take any prescribed medicines to control your diabetes as told by  your health care provider.  Control your hypertension. ? To reduce your risk of stroke, try to keep your blood pressure below 130/80. ? Eating a healthy diet and exercising regularly are an important part of controlling your blood pressure. If your blood pressure cannot be managed through diet and exercise, you may need  to take medicines. ? Take any prescribed medicines to control hypertension as told by your health care provider. ? Ask your health care provider if you should monitor your blood pressure at home. ? Have your blood pressure checked every year, even if your blood pressure is normal. Blood pressure increases with age and some medical conditions.  Get evaluated for sleep disorders (sleep apnea). Talk to your health care provider about getting a sleep evaluation if you snore a lot or have excessive sleepiness.  Take over-the-counter and prescription medicines only as told by your health care provider. Aspirin or blood thinners (antiplatelets or anticoagulants) may be recommended to reduce your risk of forming blood clots that can lead to stroke.  Make sure that any other medical conditions you have, such as atrial fibrillation or atherosclerosis, are managed. What are the warning signs of a stroke? The warning signs of a stroke can be easily remembered as BEFAST.  B is for balance. Signs include: ? Dizziness. ? Loss of balance or coordination. ? Sudden trouble walking.  E is for eyes. Signs include: ? A sudden change in vision. ? Trouble seeing.  F is for face. Signs include: ? Sudden weakness or numbness of the face. ? The face or eyelid drooping to one side.  A is for arms. Signs include: ? Sudden weakness or numbness of the arm, usually on one side of the body.  S is for speech. Signs include: ? Trouble speaking (aphasia). ? Trouble understanding.  T is for time. ? These symptoms may represent a serious problem that is an emergency. Do not wait to see if the symptoms will go away. Get medical help right away. Call your local emergency services (911 in the [place] Do not drive yourself to the hospital.  Other signs of stroke may include: ? A sudden, severe headache with no known cause. ? Nausea or vomiting. ? Seizure. Where to find more information For more information,  visit:  American Stroke Association: www.strokeassociation.org  National Stroke Association: www.stroke.org Summary  You can prevent a stroke by eating healthy, exercising, not smoking, limiting alcohol intake, and managing any medical conditions you may have.  Do not use any products that contain nicotine or tobacco, such as cigarettes and e-cigarettes. If you need help quitting, ask your health care provider. It may also be helpful to avoid exposure to secondhand smoke.  Remember BEFAST for warning signs of stroke. Get help right away if you or a loved one has any of these signs. This information is not intended to replace advice given to you by your health care provider. Make sure you discuss any questions you have with your health care provider. Document Revised: 12/12/2016 Document Reviewed: 02/05/2016 Elsevier Patient Education  2021 [employer].

## 2020-05-28 NOTE — Progress Notes (Signed)
[facility] [address] [place]. [place] [address] [phone]       OFFICE FOLLOW UP VISIT NOTE  [name] Date of Birth:  13-May-1955 Medical Record Number:  [id number]   Referring MD: Bufford Spikes, DO Reason for Referral: TIA  HPI: Initial visit 11/17/2019 :Judith Oconnor is a 65 year old pleasant Caucasian lady seen today for initial office consultation visit for speech difficulty.  History is obtained from the patient and review of referral notes and electronic medical records and I personally reviewed available imaging films in PACS.  She has past medical history of anxiety and thrombophlebitis of the legs only.  She had an episode on 09/14/2019 when she was sitting at home her office stable typing on her computer when she noticed she was not typing well and making mistakes.  She got up and she spoke to her son she noticed that she had trouble speaking and the son mentioned that her words were slurred.  There is no obvious facial droop or headache, blurred vision, diplopia, vertigo or obvious extremity weakness or numbness.  When EMT arrived and noticed her blood pressure was elevated in the 170 systolic range.  She was seen at [facility] emergency room where she had an MRI scan of the brain done which showed only few nonspecific white matter hyperintensities with no acute stroke.  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  Patient subsequently had an echocardiogram as an outpatient on 10/03/2019 which showed ejection fraction of 60 to 65% without cardiac source of embolism.  She has not yet started aspirin which she was she was advised to take.  She has changed Lipitor which she was advised to Crestor 5 mg daily which she seems to be tolerating well.  She does have family history of stroke in her maternal  grandmother.  Her LDL cholesterol on 09/14/2019 was 138 mg percent and when repeated on 11/10/2019 it has come down to 68 mg percent.  Her hemoglobin A1c was 4.9.  Patient denies any history of palpitations, syncope, cardiac arrhythmias or history of atrial fibrillation.  She states she is doing well today and has no complaints.  She is working full-time in [employer]. Update 05/28/2020 : She returns for follow-up after last visit 6 months ago.  She is doing well.  She has had no recurrent episodes of speech language difficulties or any other TIA or stroke symptoms.  She remains on aspirin she is tolerating well with without bruising or bleeding.  She is also on Crestor which is tolerating well without muscle aches and pains.  Last lipid profile was on 02/16/2020 and LDL cholesterol was optimal at 68 mg percent, triglycerides 150, HDL 52 and total cholesterol 143 mg percent.  Her blood pressure is usually better at home and today it is borderline in office at 141/69.  She does eat healthy and is quite active and exercise regularly.  She is still working at her [employer] company full-time.  She has no complaints. ROS:   14 system review of systems is positive for slurred speech, speech difficulty, right hand incoordination and all other systems negative  PMH:  Past Medical History:  Diagnosis Date  . Anxiety state, unspecified   . Contusion of buttock   . Hematoma 10/06/10   buttock   . Stroke (HCC) 09/14/2019  possible TIA  . Symptomatic menopausal or female climacteric states   . Thrombophlebitis leg 1990  . Varicose vein of leg   . Varicose veins     Social History:  Social History   Socioeconomic History  . Marital status: Widowed    Spouse name: Not on file  . Number of children: Not on file  . Years of education: Not on file  . Highest education level: Not on file  Occupational History  . Occupation: Full Time  Tobacco Use  . Smoking status: Never Smoker  .  Smokeless tobacco: Never Used  Substance and Sexual Activity  . Alcohol use: Yes    Alcohol/week: 6.0 - 8.0 standard drinks    Types: 6 - 8 Glasses of wine per week  . Drug use: No  . Sexual activity: Not on file  Other Topics Concern  . Not on file  Social History Narrative   Lives with BF   Right Handed   Drinks 1 cups caffeine daily   Social Determinants of Health   Financial Resource Strain: Not on file  Food Insecurity: Not on file  Transportation Needs: Not on file  Physical Activity: Not on file  Stress: Not on file  Social Connections: Not on file  Intimate Partner Violence: Not on file    Medications:   Current Outpatient Medications on File Prior to Visit  Medication Sig Dispense Refill  . aspirin EC 81 MG tablet Take 81 mg by mouth daily. Swallow whole.    . Cholecalciferol (VITAMIN D3) 10 MCG (400 UNIT) CAPS Take by mouth.    . rosuvastatin (CRESTOR) 5 MG tablet TAKE 1 TABLET(5 MG) BY MOUTH AT BEDTIME 90 tablet 1   Current Facility-Administered Medications on File Prior to Visit  Medication Dose Route Frequency Provider Last Rate Last Admin  . sodium chloride flush (NS) 0.9 % injection 10 mL  10 mL Intravenous PRN Reed, Tiffany L, DO   10 mL at 10/03/19 1335    Allergies:  No Known Allergies  Physical Exam General: well developed, well nourished pleasant middle-age Caucasian lady, seated, in no evident distress Head: head normocephalic and atraumatic.   Neck: supple with no carotid or supraclavicular bruits Cardiovascular: regular rate and rhythm, no murmurs Musculoskeletal: no deformity Skin:  no rash/petichiae Vascular:  Normal pulses all extremities  Neurologic Exam Mental Status: Awake and fully alert. Oriented to place and time. Recent and remote memory intact. Attention span, concentration and fund of knowledge appropriate. Mood and affect appropriate.  Cranial Nerves: Fundoscopic exam not done.. Pupils equal, briskly reactive to light. Extraocular  movements full without nystagmus. Visual fields full to confrontation. Hearing intact. Facial sensation intact. Face, tongue, palate moves normally and symmetrically.  Motor: Normal bulk and tone. Normal strength in all tested extremity muscles. Sensory.: intact to touch , pinprick , position and vibratory sensation.  Coordination: Rapid alternating movements normal in all extremities. Finger-to-nose and heel-to-shin performed accurately bilaterally. Gait and Station: Arises from chair without difficulty. Stance is normal. Gait demonstrates normal stride length and balance . Able to heel, toe and tandem walk without difficulty.  Reflexes: 1+ and symmetric. Toes downgoing.      ASSESSMENT:.  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  Vascular risk factors of hyperlipidemia and borderline hypertension.  She is stable from neurovascular standpoint.     PLAN: I had a long d/w patient about  her remote TIA,stroke, risk for recurrent stroke/TIAs, personally independently reviewed imaging studies and stroke evaluation results and answered questions.Continue aspirin 81 mg daily  for secondary stroke prevention and maintain strict control of hypertension with blood pressure goal below 130/90, diabetes with hemoglobin A1c goal below 6.5% and lipids with LDL cholesterol goal below 70 mg/dL. I also advised the patient to eat a healthy diet with plenty of whole grains, cereals, fruits and vegetables, exercise regularly and maintain ideal body weight Followup in the future with me in 1 year or call earlier if necessary.  Greater than 50% time during this 25-minute  visit was spent on counseling and coordination of care about her TIA and discussion about prevention and treatment and answering questions Delia Heady, MD Note: This document was prepared with digital dictation and  possible smart phrase technology. Any transcriptional errors that result from this process are unintentional.

## 2020-06-21 ENCOUNTER — Telehealth (INDEPENDENT_AMBULATORY_CARE_PROVIDER_SITE_OTHER): Payer: 59 | Admitting: Nurse Practitioner

## 2020-06-21 ENCOUNTER — Telehealth: Payer: Self-pay

## 2020-06-21 DIAGNOSIS — U071 COVID-19: Secondary | ICD-10-CM

## 2020-06-21 NOTE — Progress Notes (Signed)
This service is provided via telemedicine  No vital signs collected/recorded due to the encounter was a telemedicine visit.   Location of patient (ex: home, work):  Home  Patient consents to a telephone visit:  Yes, see encounter dated 06/21/2020  Location of the provider (ex: office, home):  [facility]  Name of any referring provider:  Ngetich, Dinah, NP  Names of all persons participating in the telemedicine service and their role in the encounter:  Abbey Chatters, Nurse Practitioner, Elveria Royals, CMA, and patient.   Time spent on call:  9 minutes with medical assistant

## 2020-06-21 NOTE — Telephone Encounter (Signed)
Ms. jerene, yeager are scheduled for a virtual visit with your provider today.    Just as we do with appointments in the office, we must obtain your consent to participate.  Your consent will be active for this visit and any virtual visit you may have with one of our providers in the next 365 days.    If you have a MyChart account, I can also send a copy of this consent to you electronically.  All virtual visits are billed to your insurance company just like a traditional visit in the office.  As this is a virtual visit, video technology does not allow for your provider to perform a traditional examination.  This may limit your provider's ability to fully assess your condition.  If your provider identifies any concerns that need to be evaluated in person or the need to arrange testing such as labs, EKG, etc, we will make arrangements to do so.    Although advances in technology are sophisticated, we cannot ensure that it will always work on either your end or our end.  If the connection with a video visit is poor, we may have to switch to a telephone visit.  With either a video or telephone visit, we are not always able to ensure that we have a secure connection.   I need to obtain your verbal consent now.   Are you willing to proceed with your visit today?   Judith Oconnor has provided verbal consent on 06/21/2020 for a virtual visit (video or telephone).   Elveria Royals, CMA 06/21/2020  1:51 PM

## 2020-06-21 NOTE — Progress Notes (Signed)
Careteam: Patient Care Team: Ngetich, Donalee Citrin, NP as PCP - General (Family Medicine) Pryor Ochoa, MD (Inactive) as Consulting Physician (Vascular Surgery) Freddy Finner, MD as Consulting Physician (Obstetrics and Gynecology) Sharrell Ku, MD as Consulting Physician (Gastroenterology) Luretha Murphy, MD as Consulting Physician (General Surgery) Ernesto Rutherford, MD as Consulting Physician (Ophthalmology)  Advanced Directive information    No Known Allergies  Chief Complaint  Patient presents with   Acute Visit    Positive home COVID test. Symptoms started Tuesday. Patient had known COVID exposure on Satuday. She has been having cough, sniffles and slight headache. Patient also went to [facility] today and did a COVID PCR test, waiting on results.She has been vaacinated and received one booster.     HPI: Patient is a 65 y.o. female due to COVID Her son got married this weekend and then someone tested positive the next day. 2 days ago she took a home test negative, then yesterday tested negative but today tested positive.  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  She reports she has a nasty cough occasionally but otherwise does not feel bad. Some chills and mild headache and sinus congestion.  mild sinus congestion.  She has had 3 COVID vaccinations.  No shortness of breath or chest pains.   Review of Systems:  Review of Systems  Constitutional:  Positive for chills. Negative for fever, malaise/fatigue and weight loss.  HENT:  Negative for hearing loss and tinnitus.   Respiratory:  Positive for cough. Negative for sputum production and shortness of breath.   Cardiovascular:  Negative for chest pain, palpitations and leg swelling.  Gastrointestinal:  Negative for abdominal pain, constipation, diarrhea and heartburn.  Genitourinary:  Negative for dysuria, frequency and urgency.  Musculoskeletal:  Negative for  back pain, falls, joint pain and myalgias.  Skin: Negative.   Neurological:  Negative for dizziness and headaches.   Past Medical History:  Diagnosis Date   Anxiety state, unspecified    Contusion of buttock    Hematoma 10/06/10   buttock    Stroke (HCC) 09/14/2019   possible TIA   Symptomatic menopausal or female climacteric states    Thrombophlebitis leg 1990   Varicose vein of leg    Varicose veins    Past Surgical History:  Procedure Laterality Date   CHOLECYSTECTOMY  December 1990   Dr[facility]   COLONOSCOPY  09/14/2008   Diverticulosis, Dr.Medoff    ENDOMETRIAL ABLATION  2006   Dr.Ron Jennette Kettle    VARICOSE VEIN SURGERY Left 10/12/2013   [facility]   Social History:   reports that she has never smoked. She has never used smokeless tobacco. She reports current alcohol use of about 6.0 - 8.0 standard drinks of alcohol per week. She reports that she does not use drugs.  Family History  Problem Relation Age of Onset   Macular degeneration Mother    Coronary artery disease Father    Breast cancer Neg Hx     Medications: Patient's Medications  New Prescriptions   No medications on file  Previous Medications   ASPIRIN EC 81 MG TABLET    Take 81 mg by mouth daily. Swallow whole.   CHOLECALCIFEROL (VITAMIN D3) 10 MCG (400 UNIT) CAPS    Take by mouth.   ROSUVASTATIN (CRESTOR) 5 MG TABLET    TAKE 1 TABLET(5 MG) BY MOUTH AT BEDTIME  Modified Medications  No medications on file  Discontinued Medications   No medications on file    Physical Exam:  There were no vitals filed for this visit. There is no height or weight on file to calculate BMI. Wt Readings from Last 3 Encounters:  05/28/20 163 lb 12.8 oz (74.3 kg)  02/20/20 160 lb 12.8 oz (72.9 kg)  11/17/19 165 lb 8 oz (75.1 kg)    Physical Exam Constitutional:      Appearance: Normal appearance.  Pulmonary:     Effort: Pulmonary effort is normal.  Neurological:     Mental Status: She is alert  and oriented to person, place, and time.  Psychiatric:        Mood and Affect: Mood normal.  Limited due to virtual visit.   Labs reviewed: Basic Metabolic Panel: Recent Labs    09/14/19 1737 09/14/19 1834  NA 139 139  K 3.4* 3.5  CL 102 102  CO2 23  --   GLUCOSE 117* 107*  BUN 13 14  CREATININE 0.77 0.70  CALCIUM 9.5  --    Liver Function Tests: Recent Labs    09/14/19 1737  AST 20  ALT 24  ALKPHOS 60  BILITOT 0.7  PROT 6.7  ALBUMIN 4.3   No results for input(s): LIPASE, AMYLASE in the last 8760 hours. No results for input(s): AMMONIA in the last 8760 hours. CBC: Recent Labs    09/14/19 1737 09/14/19 1834  WBC 6.5  --   HGB 12.4 11.9*  HCT 37.3 35.0*  MCV 90.5  --   PLT 238  --    Lipid Panel: Recent Labs    09/14/19 1744 11/10/19 0806 02/16/20 0811  CHOL 237* 143 128  HDL 50 52 50  LDLCALC 139* 68 57  TRIG 238* 150* 128  CHOLHDL 4.7 2.8 2.6   TSH: No results for input(s): TSH in the last 8760 hours. A1C: Lab Results  Component Value Date   HGBA1C 4.9 09/14/2019     Assessment/Plan 1. COVID-19 Mild symptoms at this time -discussed symptom management if needed -to isolate for 5 days since she has tested positive and wear a mask 5 days after that.  -educated to take vit c, vit d and zinc supplements for immune support. -tylenol as needed -to notify if symptoms worsen    Aniceto Kyser K. Biagio Borg  [facility] [phone]    Virtual Visit via mychart video  I connected with patient on 06/21/20 at  1:30 PM EDT by video and verified that I am speaking with the correct person using two identifiers.  Location: Patient: home Provider: [facility]   I discussed the limitations, risks, security and privacy concerns of performing an evaluation and management service by telephone and the availability of in person appointments. I also discussed with the patient that there may be a patient responsible charge related  to this service. The patient expressed understanding and agreed to proceed.   I discussed the assessment and treatment plan with the patient. The patient was provided an opportunity to ask questions and all were answered. The patient agreed with the plan and demonstrated an understanding of the instructions.   The patient was advised to call back or seek an in-person evaluation if the symptoms worsen or if the condition fails to improve as anticipated.  I provided 15 minutes of non-face-to-face time during this encounter.  Janene Harvey. Biagio Borg Av[employer] printed and mailed

## 2020-08-30 ENCOUNTER — Other Ambulatory Visit: Payer: Self-pay | Admitting: Family

## 2020-08-30 DIAGNOSIS — G459 Transient cerebral ischemic attack, unspecified: Secondary | ICD-10-CM

## 2020-08-30 DIAGNOSIS — E785 Hyperlipidemia, unspecified: Secondary | ICD-10-CM

## 2020-11-07 ENCOUNTER — Other Ambulatory Visit: Payer: Self-pay | Admitting: Obstetrics & Gynecology

## 2020-11-07 DIAGNOSIS — Z1231 Encounter for screening mammogram for malignant neoplasm of breast: Secondary | ICD-10-CM

## 2020-11-27 ENCOUNTER — Other Ambulatory Visit: Payer: Self-pay | Admitting: Family

## 2020-11-27 DIAGNOSIS — G459 Transient cerebral ischemic attack, unspecified: Secondary | ICD-10-CM

## 2020-11-27 DIAGNOSIS — E785 Hyperlipidemia, unspecified: Secondary | ICD-10-CM

## 2020-11-29 ENCOUNTER — Other Ambulatory Visit: Payer: Self-pay | Admitting: Family

## 2020-11-29 DIAGNOSIS — G459 Transient cerebral ischemic attack, unspecified: Secondary | ICD-10-CM

## 2020-11-29 DIAGNOSIS — E785 Hyperlipidemia, unspecified: Secondary | ICD-10-CM

## 2020-12-11 ENCOUNTER — Ambulatory Visit
Admission: RE | Admit: 2020-12-11 | Discharge: 2020-12-11 | Disposition: A | Discharge status: Home / Self Care | Payer: 59 | Source: Ambulatory Visit | Attending: Obstetrics & Gynecology | Admitting: Obstetrics & Gynecology

## 2020-12-11 ENCOUNTER — Other Ambulatory Visit: Payer: Self-pay

## 2020-12-11 DIAGNOSIS — Z1231 Encounter for screening mammogram for malignant neoplasm of breast: Secondary | ICD-10-CM

## 2021-01-19 IMAGING — CT CT HEAD W/O CM
3 of 4 series · 13 of 47 positions shown, 15 images · non-contrast
Comparison: MRI 09/14/2019

CLINICAL DATA: TIA

EXAM:
CT HEAD WITHOUT CONTRAST
TECHNIQUE: Contiguous axial images were obtained from the base of the skull
through the vertex without intravenous contrast.

[Series 3: head without · axial · non-contrast · 0.43mm/px · z∈[-91,+24]mm · 7 of 31 slices shown, 9 images]
[im 4/31  brain]
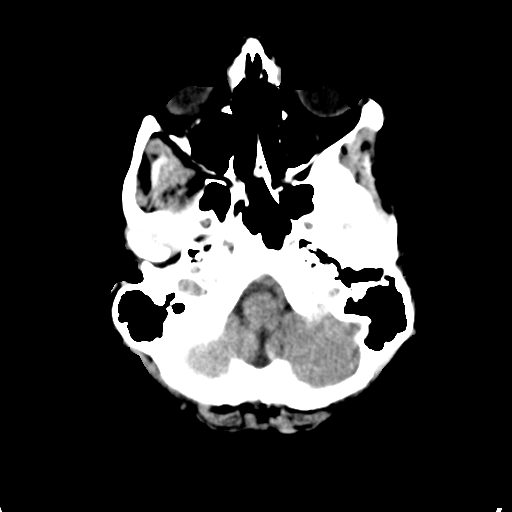
[im 4/31  bone]
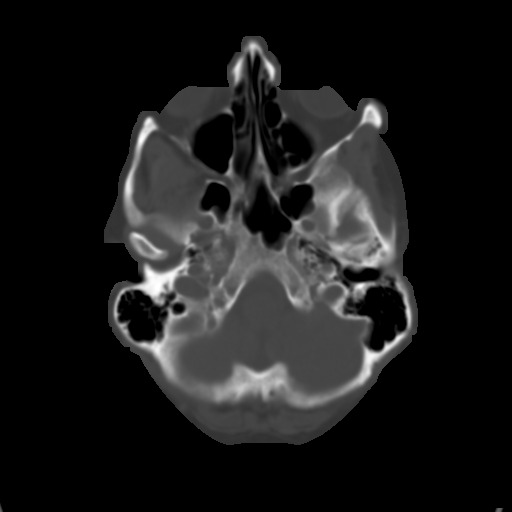
[im 8/31  brain]
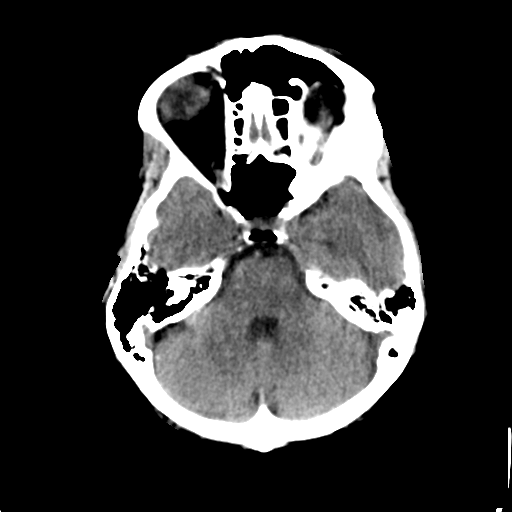
[im 12/31  brain]
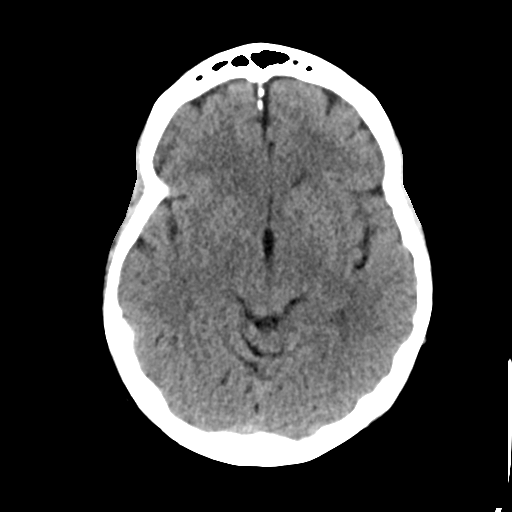
[im 16/31  brain]
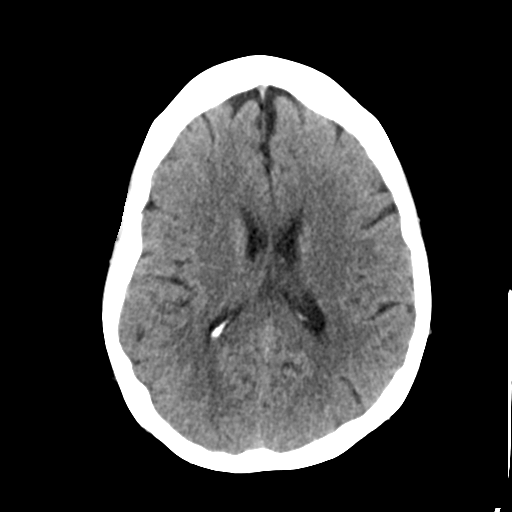
[im 19/31  brain]
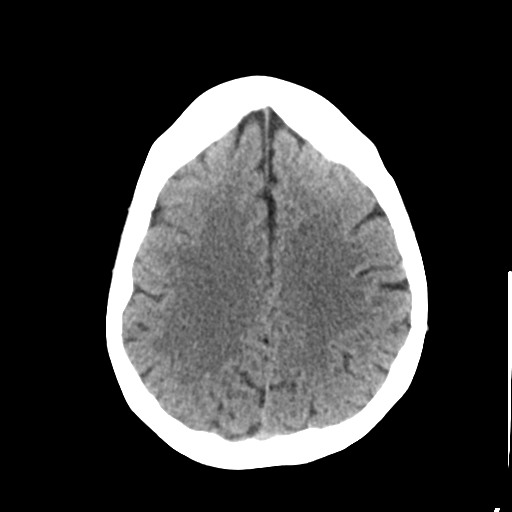
[im 19/31  bone]
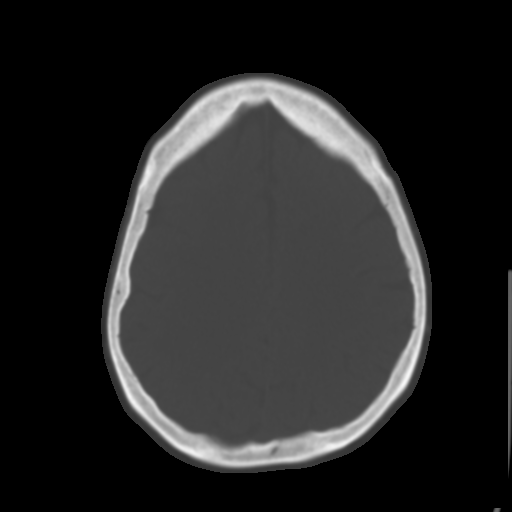
[im 23/31  brain]
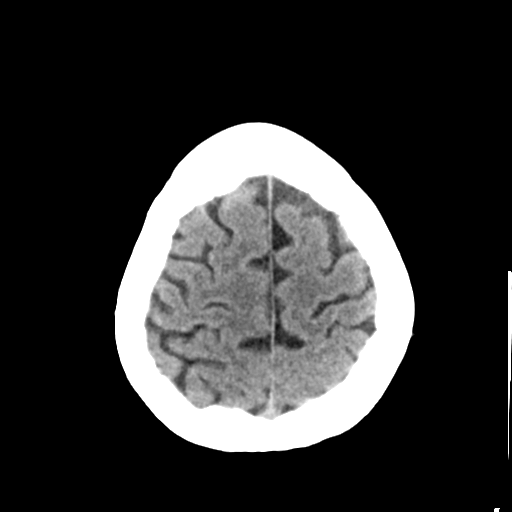
[im 27/31  brain]
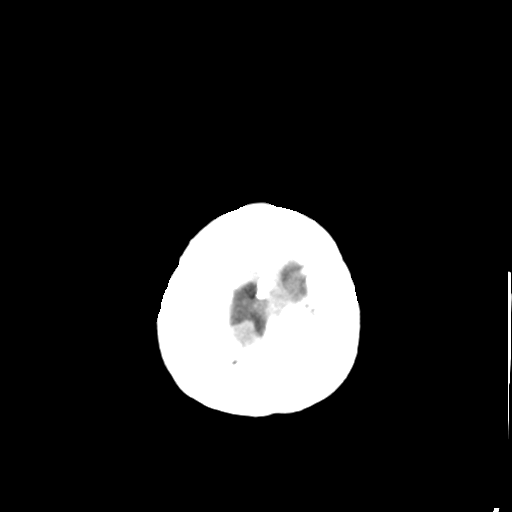

[Series 5: head without cor · coronal · non-contrast · 0.30mm/px · 3 of 67 slices shown]
[im 23/67  brain]
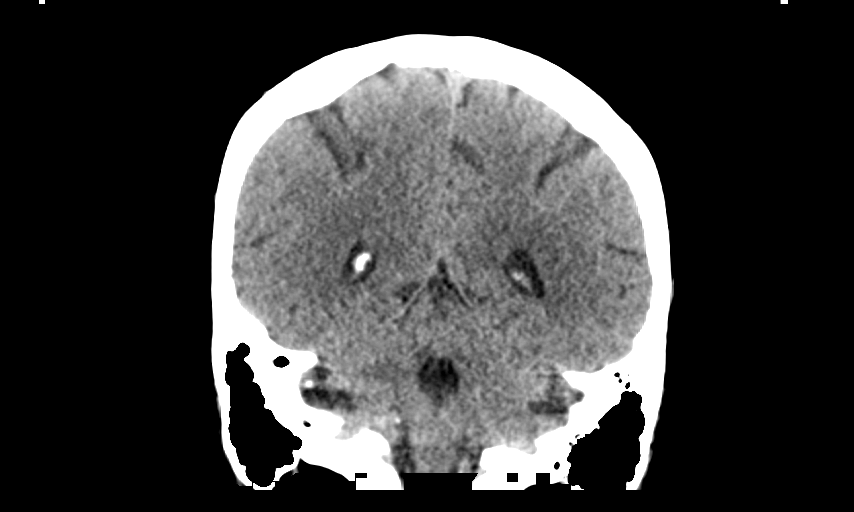
[im 30/67  brain]
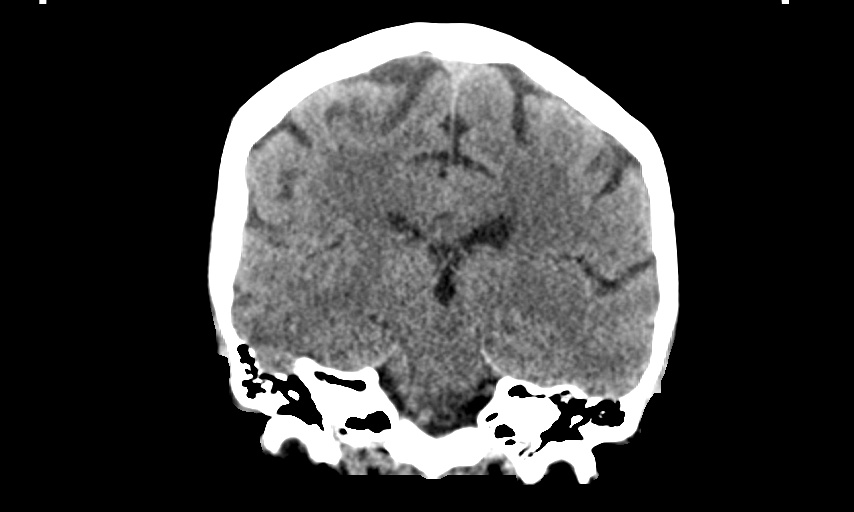
[im 37/67  brain]
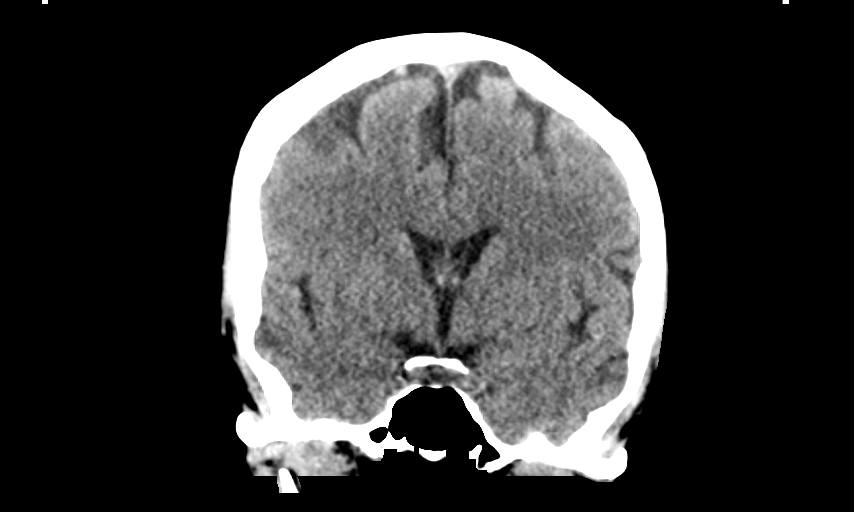

[Series 6: head without sag · sagittal · non-contrast · 0.30mm/px · 3 of 67 slices shown]
[im 23/67  brain]
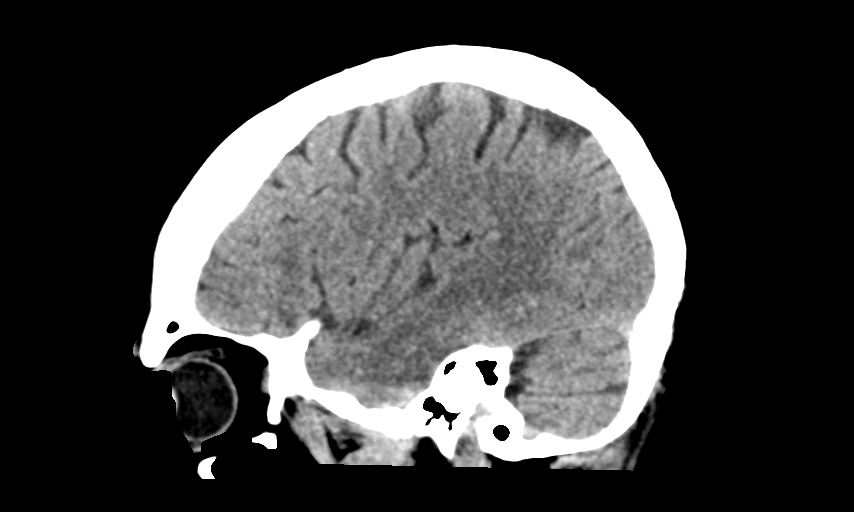
[im 34/67  brain]
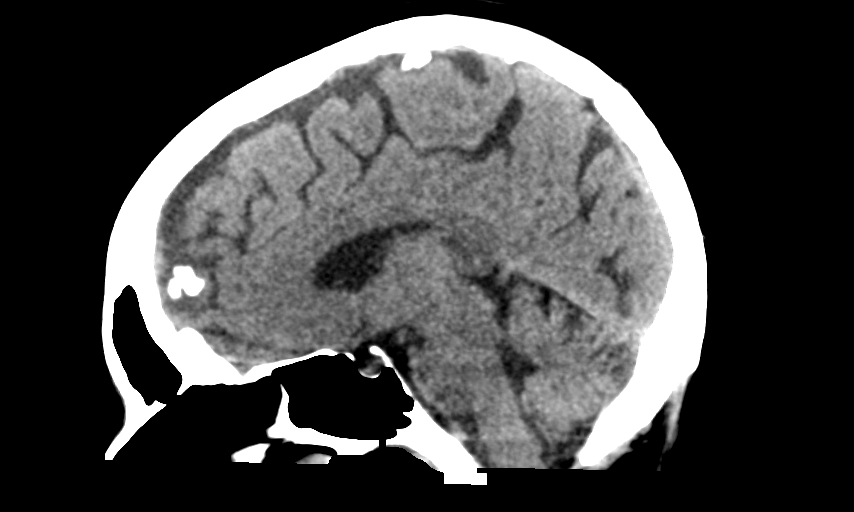
[im 45/67  brain]
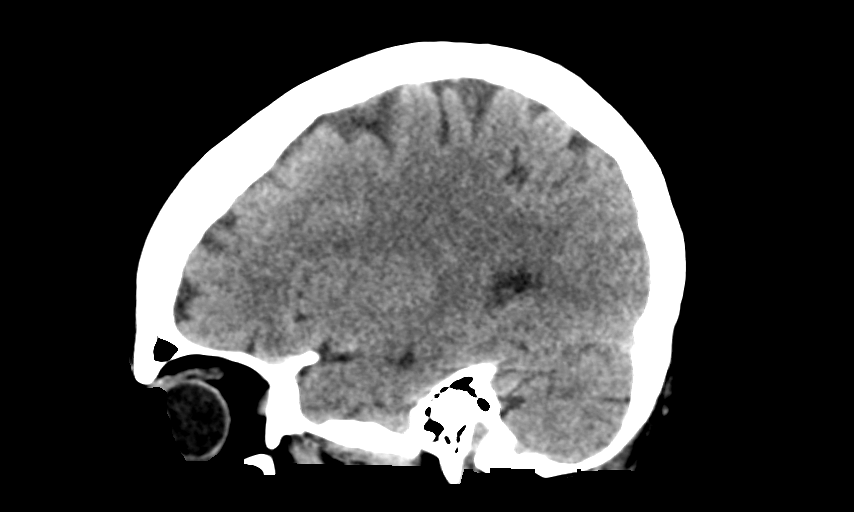

[13 of 47 positions shown; findings below may reference images not displayed]

FINDINGS: Brain: No acute territorial infarction, hemorrhage or intracranial
mass. The ventricles are nonenlarged.

Vascular: No hyperdense vessel or unexpected calcification.

Skull: Normal. Negative for fracture or focal lesion.

Sinuses/Orbits: No acute finding.

Other: None
IMPRESSION: Negative non contrasted CT appearance of the brain.

## 2021-01-19 IMAGING — MR MR HEAD W/O CM
11 series · 46 of 48 positions shown · non-contrast
Comparison: None.

CLINICAL DATA: Slurred speech and dizziness



[Series 5: DWI · axial · 3.0mm · 0.88mm/px · z∈[-86,+58]mm · 4 of 50 slices shown (1 of 4)]
[im 1/50]
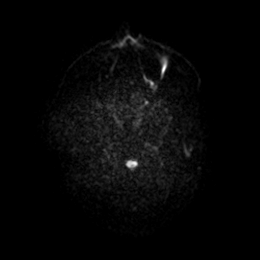
[im 17/50]
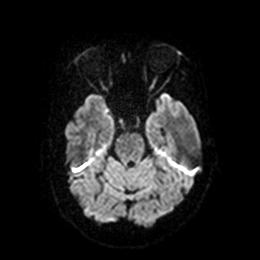
[im 33/50]
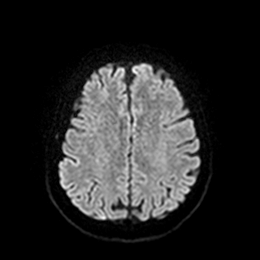
[im 50/50]
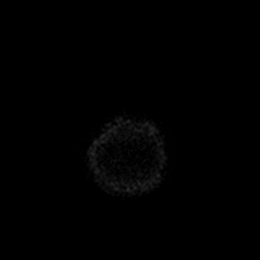

[Series 6: DWI · axial · 3.0mm · 0.88mm/px · z∈[-86,+58]mm · 4 of 50 slices shown (2 of 4)]
[im 1/50]
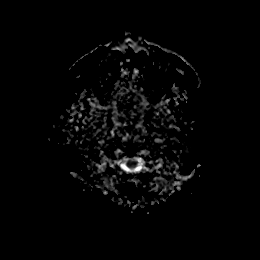
[im 17/50]
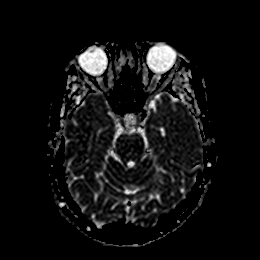
[im 33/50]
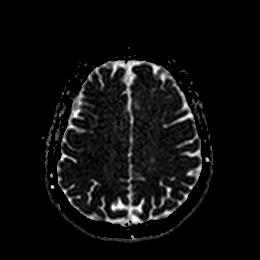
[im 50/50]
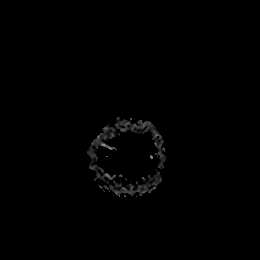

[Series 7: DWI · coronal · 5.0mm · 0.88mm/px · 3 of 38 slices shown (3 of 4)]
[im 1/38]
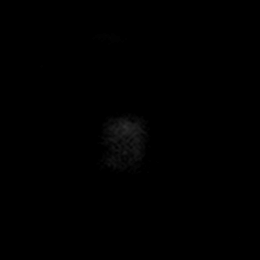
[im 19/38]
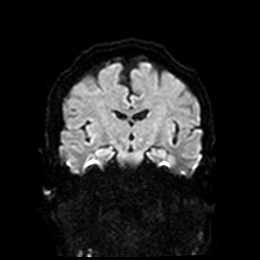
[im 38/38]
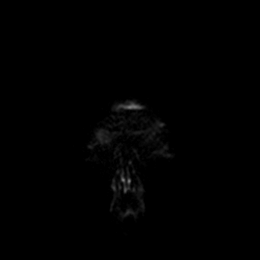

[Series 8: DWI · coronal · 5.0mm · 0.88mm/px · 3 of 38 slices shown (4 of 4)]
[im 1/38]
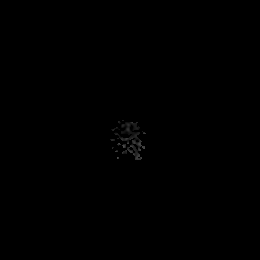
[im 19/38]
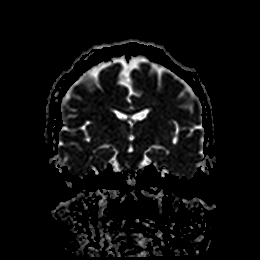
[im 38/38]
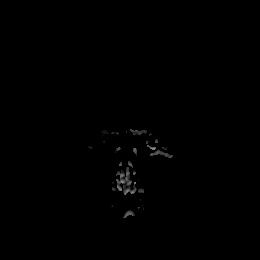

[Series 9: T1 · sagittal · 5.0mm · 0.75mm/px · 2 of 23 slices shown]
[im 1/23]
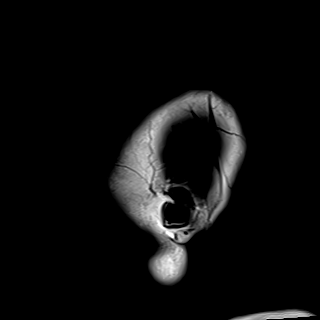
[im 23/23]
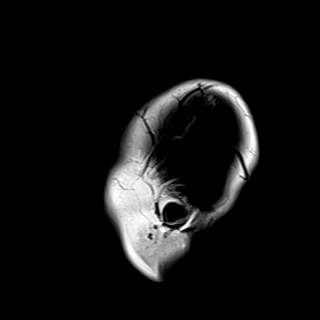

[Series 10: T2 · axial · 5.0mm · 0.72mm/px · z∈[-85,+56]mm · 2 of 25 slices shown (1 of 2)]
[im 1/25]
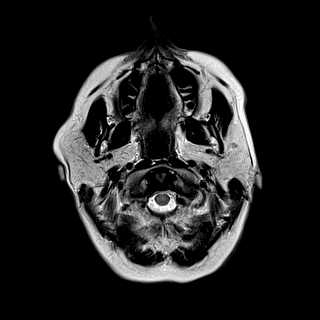
[im 25/25]
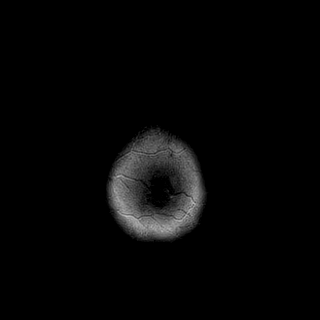

[Series 11: FLAIR · axial · 5.0mm · 0.36mm/px · z∈[-85,+56]mm · 2 of 25 slices shown]
[im 1/25]
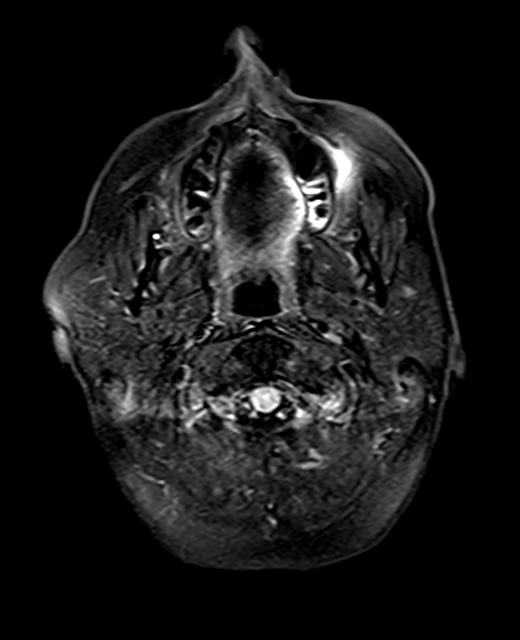
[im 25/25]
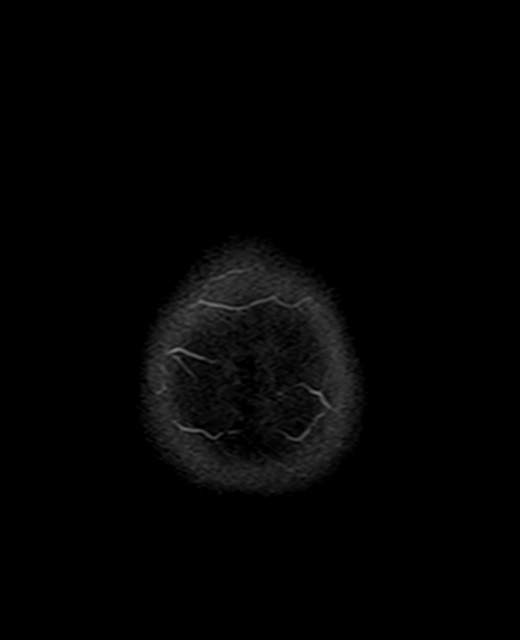

[Series 13: pha_images · axial · 3.0mm · 0.90mm/px · z∈[-93,+66]mm · 5 of 55 slices shown]
[im 1/55]
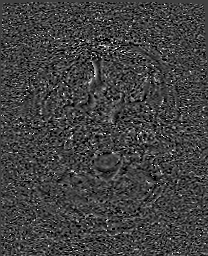
[im 14/55]
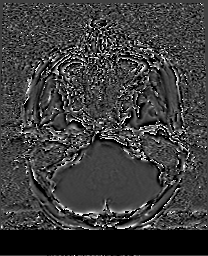
[im 28/55]
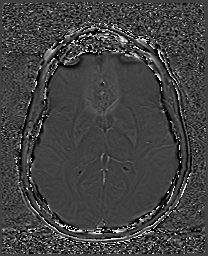
[im 41/55]
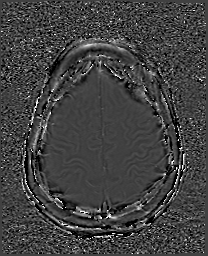
[im 55/55]
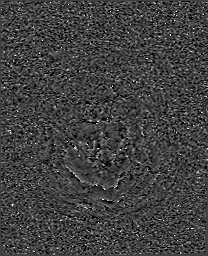

[Series 14: swi_images · axial · 3.0mm · 0.90mm/px · z∈[-93,+69]mm · 5 of 56 slices shown]
[im 1/56]
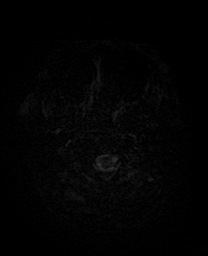
[im 14/56]
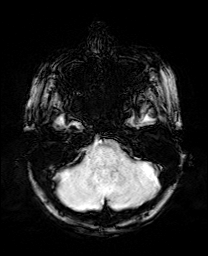
[im 28/56]
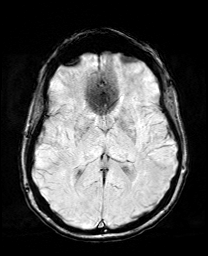
[im 42/56]
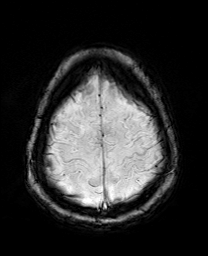
[im 56/56]
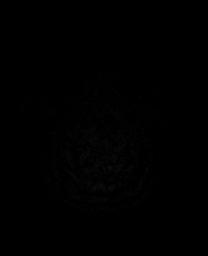

[Series 16: T2 · coronal · 5.0mm · 0.34mm/px · 3 of 31 slices shown (2 of 2)]
[im 1/31]
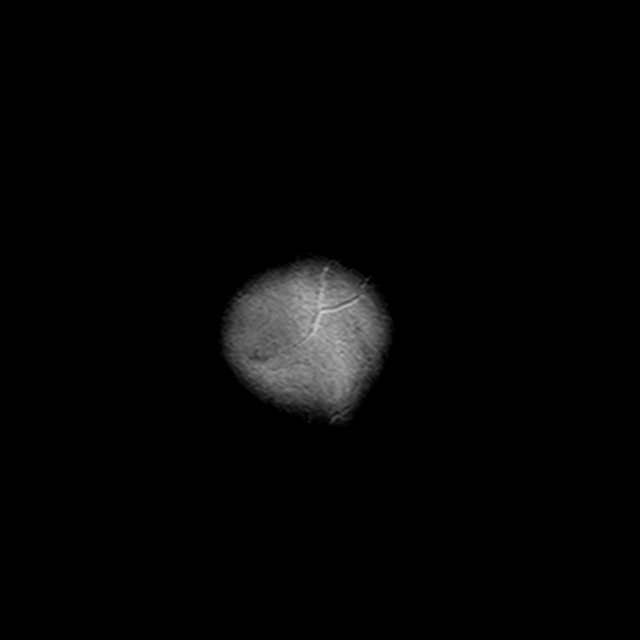
[im 16/31]
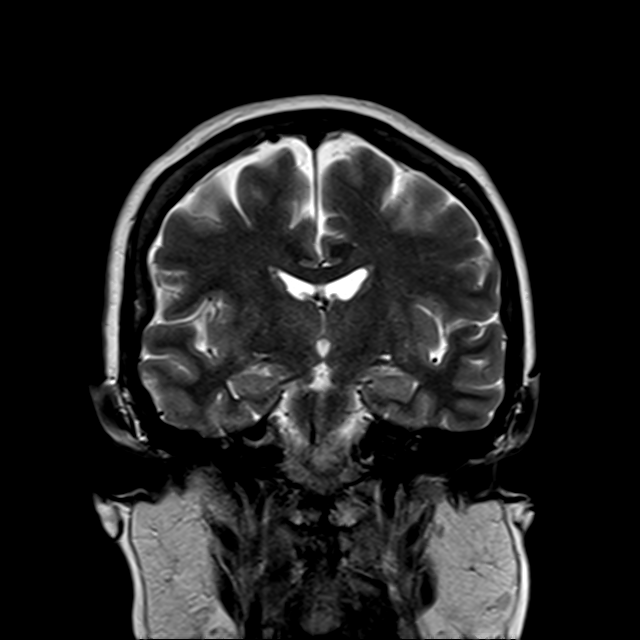
[im 31/31]
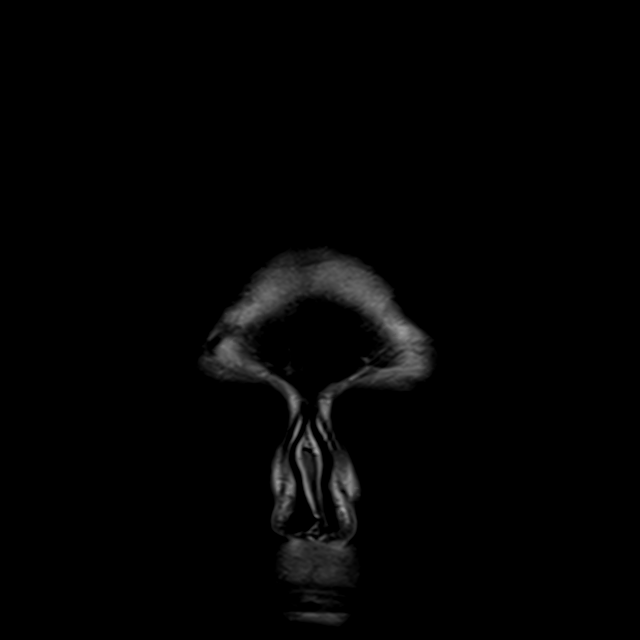

[Series 17: t1_mprage_tra_p2_iso · axial · 1.0mm · 0.98mm/px · z∈[-98,+73]mm · 13 of 175 slices shown]
[im 1/175]
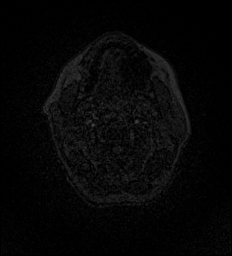
[im 13/175]
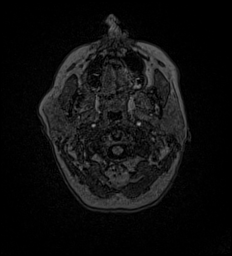
[im 25/175]
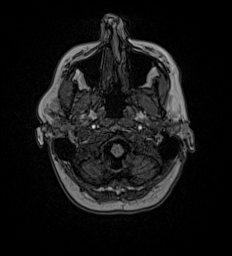
[im 38/175]
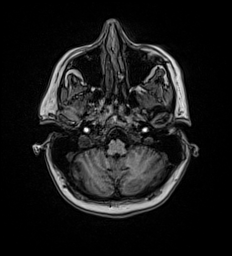
[im 50/175]
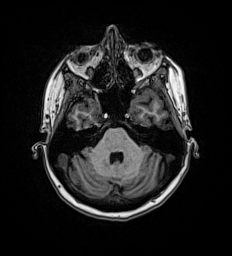
[im 63/175]
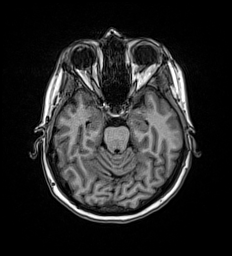
[im 75/175]
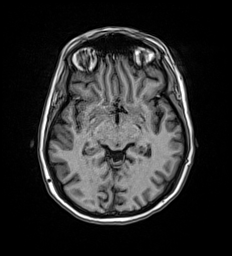
[im 88/175]
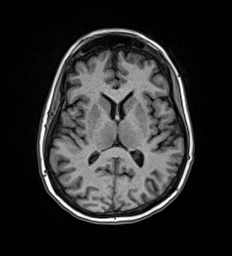
[im 100/175]
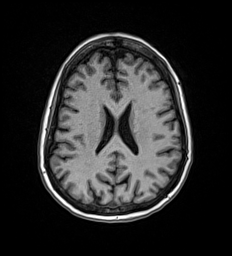
[im 112/175]
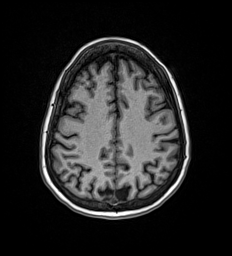
[im 125/175]
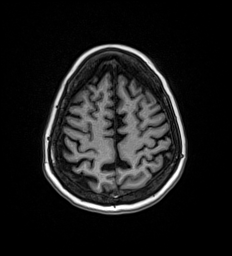
[im 150/175]
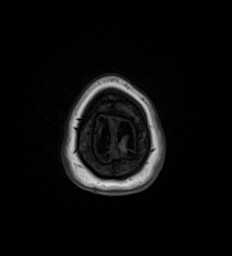
[im 175/175]
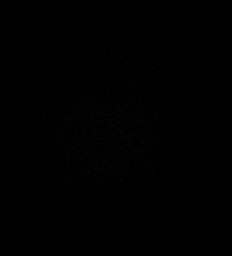

[46 of 48 positions shown; findings below may reference images not displayed]

FINDINGS: MRI HEAD FINDINGS

Brain: No acute infarct, acute hemorrhage or extra-axial collection.
Normal white matter signal. Normal volume of CSF spaces. No chronic
microhemorrhage. Normal midline structures.

Vascular: Normal flow voids.

Skull and upper cervical spine: Normal marrow signal.

Sinuses/Orbits: Negative.

Other: None.

MRA HEAD FINDINGS

POSTERIOR CIRCULATION:

--Vertebral arteries: Normal V4 segments.

--Inferior cerebellar arteries: Normal.

--Basilar artery: Normal.

--Superior cerebellar arteries: Normal.

--Posterior cerebral arteries: Normal.

ANTERIOR CIRCULATION:

--Intracranial internal carotid arteries: Normal.

--Anterior cerebral arteries (ACA): Normal. Both A1 segments are
present. Patent anterior communicating artery (a-comm).

--Middle cerebral arteries (MCA): Normal.

MRA NECK FINDINGS

Normal time-of-flight imaging of the carotid and vertebral arteries.
The vertebral system is left dominant. No carotid bifurcation
stenosis.
IMPRESSION: 1. Normal MRI of the brain.
2. Normal MRA of the head and neck.

## 2021-01-26 ENCOUNTER — Other Ambulatory Visit: Payer: Self-pay | Admitting: Family

## 2021-01-26 DIAGNOSIS — E785 Hyperlipidemia, unspecified: Secondary | ICD-10-CM

## 2021-01-26 DIAGNOSIS — G459 Transient cerebral ischemic attack, unspecified: Secondary | ICD-10-CM

## 2021-01-30 ENCOUNTER — Other Ambulatory Visit: Payer: Self-pay | Admitting: *Deleted

## 2021-01-30 DIAGNOSIS — G459 Transient cerebral ischemic attack, unspecified: Secondary | ICD-10-CM

## 2021-01-30 DIAGNOSIS — E785 Hyperlipidemia, unspecified: Secondary | ICD-10-CM

## 2021-01-30 MED ORDER — ROSUVASTATIN CALCIUM 5 MG PO TABS: ORAL_TABLET | ORAL | 1 refills | Status: DC

## 2021-01-30 NOTE — Telephone Encounter (Signed)
Patient called and stated due to Insurance change she also had to change her pharmacy. She now uses [facility].

## 2021-02-18 ENCOUNTER — Other Ambulatory Visit: Payer: Self-pay | Admitting: Family

## 2021-02-18 DIAGNOSIS — R002 Palpitations: Secondary | ICD-10-CM

## 2021-02-18 DIAGNOSIS — E785 Hyperlipidemia, unspecified: Secondary | ICD-10-CM

## 2021-02-19 ENCOUNTER — Other Ambulatory Visit: Payer: Self-pay

## 2021-02-19 DIAGNOSIS — R002 Palpitations: Secondary | ICD-10-CM

## 2021-02-19 DIAGNOSIS — E785 Hyperlipidemia, unspecified: Secondary | ICD-10-CM | POA: Diagnosis not present

## 2021-02-21 ENCOUNTER — Encounter: Payer: Self-pay | Admitting: Family

## 2021-02-21 ENCOUNTER — Ambulatory Visit: Payer: 59 | Admitting: Internal Medicine

## 2021-02-21 ENCOUNTER — Ambulatory Visit (INDEPENDENT_AMBULATORY_CARE_PROVIDER_SITE_OTHER): Payer: 59 | Admitting: Family

## 2021-02-21 ENCOUNTER — Other Ambulatory Visit: Payer: Self-pay

## 2021-02-21 VITALS — BP 134/80 | HR 65 | Temp 97.6°F | Resp 16 | Ht 67.0 in | Wt 161.4 lb

## 2021-02-21 DIAGNOSIS — Z113 Encounter for screening for infections with a predominantly sexual mode of transmission: Secondary | ICD-10-CM | POA: Diagnosis not present

## 2021-02-21 DIAGNOSIS — R69 Illness, unspecified: Secondary | ICD-10-CM | POA: Diagnosis not present

## 2021-02-21 DIAGNOSIS — Z0000 Encounter for general adult medical examination without abnormal findings: Secondary | ICD-10-CM

## 2021-02-21 DIAGNOSIS — Z1159 Encounter for screening for other viral diseases: Secondary | ICD-10-CM

## 2021-02-21 DIAGNOSIS — Z1211 Encounter for screening for malignant neoplasm of colon: Secondary | ICD-10-CM

## 2021-02-21 LAB — CBC WITH DIFFERENTIAL/PLATELET
Absolute Monocytes: 494 cells/uL (ref 200–950)
Basophils Absolute: 91 cells/uL (ref 0–200)
Basophils Relative: 1.4 %
Eosinophils Absolute: 130 cells/uL (ref 15–500)
Eosinophils Relative: 2 %
HCT: 38.7 % (ref 35.0–45.0)
Hemoglobin: 12.9 g/dL (ref 11.7–15.5)
Lymphs Abs: 1976 cells/uL (ref 850–3900)
MCH: 29.9 pg (ref 27.0–33.0)
MCHC: 33.3 g/dL (ref 32.0–36.0)
MCV: 89.6 fL (ref 80.0–100.0)
MPV: 10.4 fL (ref 7.5–12.5)
Monocytes Relative: 7.6 %
Neutro Abs: 3809 cells/uL (ref 1500–7800)
Neutrophils Relative %: 58.6 %
Platelets: 240 10*3/uL (ref 140–400)
RBC: 4.32 10*6/uL (ref 3.80–5.10)
RDW: 12.6 % (ref 11.0–15.0)
Total Lymphocyte: 30.4 %
WBC: 6.5 10*3/uL (ref 3.8–10.8)

## 2021-02-21 LAB — COMPLETE METABOLIC PANEL WITH GFR
AG Ratio: 2.3 (calc) (ref 1.0–2.5)
ALT: 20 U/L (ref 6–29)
AST: 14 U/L (ref 10–35)
Albumin: 4.4 g/dL (ref 3.6–5.1)
Alkaline phosphatase (APISO): 65 U/L (ref 37–153)
BUN: 14 mg/dL (ref 7–25)
CO2: 29 mmol/L (ref 20–32)
Calcium: 9.5 mg/dL (ref 8.6–10.4)
Chloride: 106 mmol/L (ref 98–110)
Creat: 0.72 mg/dL (ref 0.50–1.05)
Globulin: 1.9 g/dL (calc) (ref 1.9–3.7)
Glucose, Bld: 105 mg/dL — ABNORMAL HIGH (ref 65–99)
Potassium: 4.7 mmol/L (ref 3.5–5.3)
Sodium: 142 mmol/L (ref 135–146)
Total Bilirubin: 0.4 mg/dL (ref 0.2–1.2)
Total Protein: 6.3 g/dL (ref 6.1–8.1)
eGFR: 93 mL/min/{1.73_m2} (ref >=60)

## 2021-02-21 LAB — LIPID PANEL
Cholesterol: 129 mg/dL (ref <200)
HDL: 46 mg/dL — ABNORMAL LOW (ref >=50)
LDL Cholesterol (Calc): 59 mg/dL (calc)
Non-HDL Cholesterol (Calc): 83 mg/dL (calc) (ref <130)
Total CHOL/HDL Ratio: 2.8 (calc) (ref <5.0)
Triglycerides: 159 mg/dL — ABNORMAL HIGH (ref <150)

## 2021-02-21 LAB — TEST AUTHORIZATION

## 2021-02-21 LAB — TSH: TSH: 2.97 mIU/L (ref 0.40–4.50)

## 2021-02-21 LAB — HEMOGLOBIN A1C
Hgb A1c MFr Bld: 5.3 % of total Hgb (ref <5.7)
Mean Plasma Glucose: 105 mg/dL
eAG (mmol/L): 5.8 mmol/L

## 2021-02-21 NOTE — Progress Notes (Signed)
Provider: Marlowe Sax FNP-C   Tyla Burgner, Nelda Bucks, NP  Patient Care Team: Johnathon Mittal, Nelda Bucks, NP as PCP - General (Family Medicine) Mal Misty, MD (Inactive) as Consulting Physician (Vascular Surgery) Maisie Fus, MD as Consulting Physician (Obstetrics and Gynecology) Richmond Campbell, MD as Consulting Physician (Gastroenterology) Johnathan Hausen, MD as Consulting Physician (General Surgery) Clent Jacks, MD as Consulting Physician (Ophthalmology)  Extended Emergency Contact Information Primary Emergency Contact: Elliott,Ken Address: [place], [place] [address] Johnnette Litter of [place] Phone: [phone] Relation: Significant other Secondary Emergency Contact: [place] Phone: [phone] Relation: Daughter Father: Montel Culver, [place] of [place] Phone: [phone]  Code Status:  Full Code  Goals of care: Advanced Directive information Advanced Directives 02/21/2021  Does Patient Have a Medical Advance Directive? No  Type of Advance Directive -  Does patient want to make changes to medical advance directive? -  Copy of [place] in Chart? -  Would patient like information on creating a medical advance directive? No - Patient declined     Chief Complaint  Patient presents with   Annual Exam    Physical Exam   Health Maintenance    Discuss the need for HIV screening, Hepatitis C Screening, Dexa Scan, and Colonoscopy.    Immunizations    Discuss the need for Shingrix vaccine, and Covid Booster.    HPI:  Pt is a 66 y.o. female seen today for Annual Physical examination. Fasting lab results reviewed.TRG 159,Glucose 105 will add A1C to current labs. Continues to watch her diet and does exercise.Has lost 3 lbs since last visit.  Denies any acute issues.  Past Medical History:  Diagnosis Date   Anxiety state, unspecified    Contusion of buttock    Hematoma 10/06/10    buttock    Stroke ([facility]) 09/14/2019   possible TIA   Symptomatic menopausal or female climacteric states    Thrombophlebitis leg 1990   Varicose vein of leg    Varicose veins    Past Surgical History:  Procedure Laterality Date   CHOLECYSTECTOMY  December 1990   Dr.Matt Martin   COLONOSCOPY  09/14/2008   Diverticulosis, Dr.Medoff    ENDOMETRIAL ABLATION  2006   [place] VEIN SURGERY Left 10/12/2013   [facility]    No Known Allergies  Allergies as of 02/21/2021   No Known Allergies      Medication List        Accurate as of February 21, 2021  9:25 AM. If you have any questions, ask your nurse or doctor.          aspirin EC 81 MG tablet Take 81 mg by mouth daily. Swallow whole.   rosuvastatin 5 MG tablet Commonly known as: CRESTOR TAKE 1 TABLET(5 MG) BY MOUTH AT BEDTIME   Vitamin D3 10 MCG (400 UNIT) Caps Take by mouth.   Zoster Vaccine Adjuvanted injection Commonly known as: SHINGRIX Inject 0.5 mLs into the muscle once.        Review of Systems  Constitutional:  Negative for appetite change, chills, fatigue, fever and unexpected weight change.  HENT:  Negative for congestion, dental problem, ear discharge, ear pain, facial swelling, hearing loss, nosebleeds, postnasal drip, rhinorrhea, sinus pressure, sinus pain, sneezing, sore throat, tinnitus and trouble swallowing.   Eyes:  Negative for pain, discharge,  redness, itching and visual disturbance.  Respiratory:  Negative for cough, chest tightness, shortness of breath and wheezing.   Cardiovascular:  Negative for chest pain, palpitations and leg swelling.  Gastrointestinal:  Negative for abdominal distention, abdominal pain, blood in stool, constipation, diarrhea, nausea and vomiting.  Endocrine: Negative for cold intolerance, heat intolerance, polydipsia, polyphagia and polyuria.  Genitourinary:  Negative for difficulty urinating, dysuria, flank pain, frequency and urgency.   Musculoskeletal:  Negative for arthralgias, back pain, gait problem, joint swelling, myalgias, neck pain and neck stiffness.  Skin:  Negative for color change, pallor, rash and wound.  Neurological:  Negative for dizziness, syncope, speech difficulty, weakness, light-headedness, numbness and headaches.  Hematological:  Does not bruise/bleed easily.  Psychiatric/Behavioral:  Negative for agitation, behavioral problems, confusion, hallucinations, self-injury, sleep disturbance and suicidal ideas. The patient is not nervous/anxious.    Immunization History  Administered Date(s) Administered   Influenza Inj Mdck Quad Pf 10/28/2017   Influenza, Quadrivalent, Recombinant, Inj, Pf 10/28/2017   Influenza,inj,Quad PF,6+ Mos 11/30/2012, 01/03/2015, 10/24/2015, 12/16/2016, 10/22/2018, 09/29/2019   Influenza-Unspecified 11/19/2006, 10/11/2007, 11/25/2011, 10/17/2013, 10/12/2020   PFIZER(Purple Top)SARS-COV-2 Vaccination 01/30/2019, 02/17/2019, 11/04/2019, 10/03/2020   Tdap 11/25/2011   Pertinent  Health Maintenance Due  Topic Date Due   DEXA SCAN  06/12/2018   COLONOSCOPY (Pts 45-92yr Insurance coverage will need to be confirmed)  09/15/2018   PAP SMEAR-Modifier  11/14/2021   MAMMOGRAM  12/12/2022   INFLUENZA VACCINE  Completed   Fall Risk 02/03/2019 09/14/2019 09/29/2019 02/20/2020 02/21/2021  Falls in the past year? 0 - 0 0 0  Was there an injury with Fall? 0 - 0 0 0  Fall Risk Category Calculator 0 - 0 0 0  Fall Risk Category Low - Low Low Low  Patient Fall Risk Level Low fall risk Low fall risk Low fall risk - Low fall risk  Patient at Risk for Falls Due to - - - - No Fall Risks  Fall risk Follow up - - - - Falls evaluation completed;Education provided;Falls prevention discussed   Functional Status Survey:    Vitals:   02/21/21 0842  BP: 134/80  Pulse: 65  Resp: 16  Temp: 97.6 F (36.4 C)  SpO2: 98%  Weight: 161 lb 6.4 oz (73.2 kg)  Height: 5' 7"  (1.702 m)   Body mass index is 25.28  kg/m. Physical Exam Vitals reviewed.  Constitutional:      General: She is not in acute distress.    Appearance: Normal appearance. She is normal weight. She is not ill-appearing or diaphoretic.  HENT:     Head: Normocephalic.     Right Ear: Tympanic membrane, ear canal and external ear normal. There is no impacted cerumen.     Left Ear: Tympanic membrane, ear canal and external ear normal. There is no impacted cerumen.     Nose: Nose normal. No congestion or rhinorrhea.     Mouth/Throat:     Mouth: Mucous membranes are moist.     Pharynx: Oropharynx is clear. No oropharyngeal exudate or posterior oropharyngeal erythema.  Eyes:     General: No scleral icterus.       Right eye: No discharge.        Left eye: No discharge.     Extraocular Movements: Extraocular movements intact.     Conjunctiva/sclera: Conjunctivae normal.     Pupils: Pupils are equal, round, and reactive to light.  Neck:     Vascular: No carotid bruit.  Cardiovascular:     Rate and  Rhythm: Normal rate and regular rhythm.     Pulses: Normal pulses.     Heart sounds: Normal heart sounds. No murmur heard.   No friction rub. No gallop.  Pulmonary:     Effort: Pulmonary effort is normal. No respiratory distress.     Breath sounds: Normal breath sounds. No wheezing, rhonchi or rales.  Chest:     Chest wall: No tenderness.  Abdominal:     General: Bowel sounds are normal. There is no distension.     Palpations: Abdomen is soft. There is no mass.     Tenderness: There is no abdominal tenderness. There is no right CVA tenderness, left CVA tenderness, guarding or rebound.  Musculoskeletal:        General: No swelling or tenderness. Normal range of motion.     Cervical back: Normal range of motion. No rigidity or tenderness.     Right lower leg: No edema.     Left lower leg: No edema.  Lymphadenopathy:     Cervical: No cervical adenopathy.  Skin:    General: Skin is warm and dry.     Coloration: Skin is not pale.      Findings: No bruising, erythema, lesion or rash.  Neurological:     Mental Status: She is alert and oriented to person, place, and time.     Cranial Nerves: No cranial nerve deficit.     Sensory: No sensory deficit.     Motor: No weakness.     Coordination: Coordination normal.     Gait: Gait normal.  Psychiatric:        Mood and Affect: Mood normal.        Speech: Speech normal.        Behavior: Behavior normal.        Thought Content: Thought content normal.        Judgment: Judgment normal.    Labs reviewed: Recent Labs    02/19/21 0807  NA 142  K 4.7  CL 106  CO2 29  GLUCOSE 105*  BUN 14  CREATININE 0.72  CALCIUM 9.5   Recent Labs    02/19/21 0807  AST 14  ALT 20  BILITOT 0.4  PROT 6.3   Recent Labs    02/19/21 0807  WBC 6.5  NEUTROABS 3,809  HGB 12.9  HCT 38.7  MCV 89.6  PLT 240   Lab Results  Component Value Date   TSH 2.97 02/19/2021   Lab Results  Component Value Date   HGBA1C 4.9 09/14/2019   Lab Results  Component Value Date   CHOL 129 02/19/2021   HDL 46 (L) 02/19/2021   LDLCALC 59 02/19/2021   TRIG 159 (H) 02/19/2021   CHOLHDL 2.8 02/19/2021    Significant Diagnostic Results in last 30 days:  No results found.  Assessment/Plan 1. Need for hepatitis C screening test Low risk  - Hep C Antibody; Future  2. Annual physical exam Annual Physical Exam   Immunization date except due for shingles,COVID-19 and Influenza vaccine. Colon cancer screening recommended.Dexa scan due but stated will follow up with gynecology. Medication and labs reviewed patient counselled regarding yearly exam, prevention of dental and periodontal disease, diet, regular sustained exercise for at least 30 minutes x 3 /week, recommended schedule for routine labs. Fall screening,PHQ 9 up dated.  - CBC with Differential/Platelet; Future - CMP with eGFR([facility]); Future - TSH; Future - Lipid panel; Future  3. Screen for STD (sexually transmitted disease) No  high risk behaviors  reported  - HIV antibody (with reflex); Future  4. Colon cancer screening Asymptomatic  - Ambulatory referral to Gastroenterology   Family/ staff Communication: Reviewed plan of care with patient verbalized understanding   Labs/tests ordered:   - CBC with Differential/Platelet; Future - CMP with eGFR([facility]); Future - TSH; Future - Lipid panel; Future - Hep C Antibody; Future - HIV antibody (with reflex); Future  Next Appointment :  1 year for annual Physical exam with fasting labs    Sandrea Hughs, NP

## 2021-02-21 NOTE — Patient Instructions (Signed)

## 2021-02-28 ENCOUNTER — Other Ambulatory Visit: Payer: Self-pay

## 2021-02-28 DIAGNOSIS — E785 Hyperlipidemia, unspecified: Secondary | ICD-10-CM

## 2021-02-28 DIAGNOSIS — R002 Palpitations: Secondary | ICD-10-CM

## 2021-02-28 DIAGNOSIS — Z0000 Encounter for general adult medical examination without abnormal findings: Secondary | ICD-10-CM

## 2021-02-28 DIAGNOSIS — R7303 Prediabetes: Secondary | ICD-10-CM

## 2021-02-28 NOTE — Progress Notes (Signed)
This encounter was created in error - please disregard.

## 2021-02-28 NOTE — Addendum Note (Signed)
Addended by: Elveria Royals on: 02/28/2021 10:14 AM   Modules accepted: Orders, Level of Service

## 2021-05-20 ENCOUNTER — Telehealth: Payer: Self-pay | Admitting: Neurology

## 2021-05-20 NOTE — Telephone Encounter (Signed)
LVM and sent mychart message asking pt to call back and r/s- MD out 5/16. ?

## 2021-05-21 ENCOUNTER — Telehealth (INDEPENDENT_AMBULATORY_CARE_PROVIDER_SITE_OTHER): Payer: 59 | Admitting: Nurse Practitioner

## 2021-05-21 DIAGNOSIS — U071 COVID-19: Secondary | ICD-10-CM

## 2021-05-21 NOTE — Progress Notes (Signed)
This service is provided via telemedicine ? ?No vital signs collected/recorded due to the encounter was a telemedicine visit.  ? ?Location of patient (ex: home, work):  Home ? ?Patient consents to a telephone visit:  Yes, see encounter dated 06/21/2020 ? ?Location of the provider (ex: office, home):  [place] ? ?Name of any referring provider:  Marlowe Sax, NP ? ?Names of all persons participating in the telemedicine service and their role in the encounter:  Sherrie Mustache, Nurse Practitioner, Carroll Kinds, CMA, and patient.  ? ?Time spent on call:  12 minutes with medical assistant ? ?

## 2021-05-21 NOTE — Progress Notes (Signed)
? ? ?Careteam: ?Patient Care Team: ?Ngetich, Donalee Citrin, NP as PCP - General (Family Medicine) ?Pryor Ochoa, MD (Inactive) as Consulting Physician (Vascular Surgery) ?Freddy Finner, MD as Consulting Physician (Obstetrics and Gynecology) ?Sharrell Ku, MD as Consulting Physician (Gastroenterology) ?Luretha Murphy, MD as Consulting Physician (General Surgery) ?Ernesto Rutherford, MD as Consulting Physician (Ophthalmology) ? ?Advanced Directive information ?  ? ?No Known Allergies ? ?Chief Complaint  ?Patient presents with  ? Acute Visit  ?  Patient tested positive for COVID. Patient tested positive this morning. Patient's symptoms started yesterday. Patient went to family wedding the weekend and a lot of people have it. Patient is having runny nose, coughing, possible fever and headache.  Patient has head congestion. Patient has been taking ibuprofen  ? ? ? ?HPI: Patient is a 66 y.o. female tested positive for COVID this morning. ?Her niece got married this weekend and several ppl got sick.  ?Yesterday she had a runny nose but contributed it to her 36 month old. ?She reports she developed cough, chills over night.  ?Congestions and nose running worse symptoms.  ?Taking ibuprofen but does not feel like it is helping much.  ? ? ?Review of Systems:  ?Review of Systems  ?Constitutional:  Positive for chills and malaise/fatigue. Negative for fever and weight loss.  ?HENT:  Positive for congestion. Negative for sore throat and tinnitus.   ?Respiratory:  Positive for cough. Negative for sputum production and shortness of breath.   ?Cardiovascular:  Negative for chest pain, palpitations and leg swelling.  ?Gastrointestinal:  Negative for abdominal pain, constipation, diarrhea and heartburn.  ?Skin: Negative.   ?Neurological:  Negative for dizziness and headaches.  ? ?Past Medical History:  ?Diagnosis Date  ? Anxiety state, unspecified   ? Contusion of buttock   ? Hematoma 10/06/10  ? buttock   ? Stroke ([facility]) 09/14/2019  ?  possible TIA  ? Symptomatic menopausal or female climacteric states   ? Thrombophlebitis leg 1990  ? Varicose vein of leg   ? Varicose veins   ? ?Past Surgical History:  ?Procedure Laterality Date  ? CHOLECYSTECTOMY  December 1990  ? Dr.Matt Daphine Deutscher  ? COLONOSCOPY  09/14/2008  ? Diverticulosis, Dr.Medoff   ? ENDOMETRIAL ABLATION  2006  ? Dr.Ron Jennette Kettle   ? VARICOSE VEIN SURGERY Left 10/12/2013  ? [facility]  ? ?Social History: ?  reports that she has never smoked. She has never used smokeless tobacco. She reports current alcohol use of about 1.0 - 2.0 standard drink per week. She reports that she does not use drugs. ? ?Family History  ?Problem Relation Age of Onset  ? Macular degeneration Mother   ? Coronary artery disease Father   ? Breast cancer Neg Hx   ? ? ?Medications: ?Patient's Medications  ?New Prescriptions  ? No medications on file  ?Previous Medications  ? ASPIRIN EC 81 MG TABLET    Take 81 mg by mouth daily. Swallow whole.  ? CHOLECALCIFEROL (VITAMIN D3) 10 MCG (400 UNIT) CAPS    Take by mouth.  ? ROSUVASTATIN (CRESTOR) 5 MG TABLET    TAKE 1 TABLET(5 MG) BY MOUTH AT BEDTIME  ?Modified Medications  ? No medications on file  ?Discontinued Medications  ? ZOSTER VACCINE ADJUVANTED [facility]) INJECTION    Inject 0.5 mLs into the muscle once.  ? ? ?Physical Exam: ? ?There were no vitals filed for this visit. ?There is no height or weight on file to calculate BMI. ?Wt Readings from Last  3 Encounters:  ?02/21/21 161 lb 6.4 oz (73.2 kg)  ?05/28/20 163 lb 12.8 oz (74.3 kg)  ?02/20/20 160 lb 12.8 oz (72.9 kg)  ? ? ?Physical Exam ?Constitutional:   ?   Appearance: Normal appearance.  ?Pulmonary:  ?   Effort: Pulmonary effort is normal.  ?Neurological:  ?   Mental Status: She is alert. Mental status is at baseline.  ?Psychiatric:     ?   Mood and Affect: Mood normal.  ? ? ?Labs reviewed: ?Basic Metabolic Panel: ?Recent Labs  ?  02/19/21 ?0807  ?NA 142  ?K 4.7  ?CL 106  ?CO2 29  ?GLUCOSE 105*  ?BUN 14   ?CREATININE 0.72  ?CALCIUM 9.5  ?TSH 2.97  ? ?Liver Function Tests: ?Recent Labs  ?  02/19/21 ?0807  ?AST 14  ?ALT 20  ?BILITOT 0.4  ?PROT 6.3  ? ?No results for input(s): LIPASE, AMYLASE in the last 8760 hours. ?No results for input(s): AMMONIA in the last 8760 hours. ?CBC: ?Recent Labs  ?  02/19/21 ?0807  ?WBC 6.5  ?NEUTROABS 3,809  ?HGB 12.9  ?HCT 38.7  ?MCV 89.6  ?PLT 240  ? ?Lipid Panel: ?Recent Labs  ?  02/19/21 ?0807  ?CHOL 129  ?HDL 46*  ?LDLCALC 59  ?TRIG 159*  ?CHOLHDL 2.8  ? ?TSH: ?Recent Labs  ?  02/19/21 ?0807  ?TSH 2.97  ? ?A1C: ?Lab Results  ?Component Value Date  ? HGBA1C 5.3 02/19/2021  ? ? ? ?Assessment/Plan ?1. COVID-19 ?-symptom management at this time ?Netipot or saline wash twice daily ?Plain nasal saline spray throughout the day as needed ?May use tylenol 500 mg 2 tablets every 8 hours as needed aches and pains or sore throat ?humidifier in the home to help with the dry air ?Mucinex DM by mouth twice daily as needed for cough and chest congestion with full glass of water  ?Keep well hydrated ?Proper nutrition  ?Avoid forcefully blowing nose ?Vit c 1000 mg 1-2 x daily by mouth  ?Vit d 2000 units daily  ?Zinc 50 mg daily  ?-Your test for COVID-19 was positive, meaning that you were infected with the coronavirus and could give the germ to others.  Please continue isolation at home for at least 5 days since the start of your symptoms. On day 5, as long as your symptoms are improving and you are not having a fever, you can return to normal activities, ensuring you are wearing a mask at all times when not at home.   ?Once you complete your 10 days you are good to resume normal acitivities.   ? ?Janene Harvey. Janyth Contes, AGNP ? ?[facility] ?[phone]  ? ? ?Virtual Visit via [place], video  ? ?I connected with patient on 05/21/21 at 10:40 AM EDT by video and verified that I am speaking with the correct person using two identifiers. ? ?Location: ?Patient: home ?Provider: [facility]  [facility].  ?  ?I discussed the limitations, risks, security and privacy concerns of performing an evaluation and management service by telephone and the availability of in person appointments. I also discussed with the patient that there may be a patient responsible charge related to this service. The patient expressed understanding and agreed to proceed. ? ? ?I discussed the assessment and treatment plan with the patient. The patient was provided an opportunity to ask questions and all were answered. The patient agreed with the plan and demonstrated an understanding of the instructions. ?  ?The patient was advised  to call back or seek an in-person evaluation if the symptoms worsen or if the condition fails to improve as anticipated. ? ?I provided 15 minutes of non-face-to-face time during this encounter. ? ?Janene HarveyJessica K. Janyth ContesEubanks, AGNP ?[facility]vs printed and mailed  ? ?

## 2021-05-28 ENCOUNTER — Ambulatory Visit: Payer: 59 | Admitting: Neurology

## 2021-06-04 ENCOUNTER — Ambulatory Visit: Payer: Self-pay

## 2021-06-04 ENCOUNTER — Ambulatory Visit (INDEPENDENT_AMBULATORY_CARE_PROVIDER_SITE_OTHER): Payer: Self-pay | Admitting: Sports Medicine

## 2021-06-04 VITALS — BP 122/74 | Ht 67.0 in | Wt 160.0 lb

## 2021-06-04 DIAGNOSIS — M7660 Achilles tendinitis, unspecified leg: Secondary | ICD-10-CM

## 2021-06-04 DIAGNOSIS — S86002A Unspecified injury of left Achilles tendon, initial encounter: Secondary | ICD-10-CM

## 2021-06-04 NOTE — Progress Notes (Addendum)
   Subjective:    Patient ID: ZAHLIA Oconnor, female    DOB: 21-Apr-1955, 65 y.o.   MRN: [id number]  HPI chief complaint: Left heel pain  Judith Oconnor is a very pleasant 66 year old female that comes in today complaining of 1 week of severe posterior left heel pain.  She recently started playing pickle ball and began to notice some discomfort in the posterior heel on May 7.  She then contracted COVID and was unable to play for a while.  She then returned to the court 1 week ago and felt a sudden onset of acute pain when going for a shot.  She has not noticed any bruising or swelling.  She is tried ibuprofen, ice, and elevation.  Ice does seem to be helpful.  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  Her current pain is different in nature than what she experienced in 2018.  In addition to pickleball she also enjoys walking for exercise.  She denies pain on the plantar aspect of her foot.  Interim medical history reviewed Medications reviewed Allergies reviewed   Review of Systems As above    Objective:   Physical Exam  Well-developed, well-nourished.  No acute distress  Left heel: There is tenderness to palpation at the calcaneal insertion of the plantar fascia.  No tenderness to palpation along the mid substance of the Achilles tendon.  No tenderness to palpation at the calcaneal origin of the plantar fascia.  No swelling.  No ecchymosis.  Good plantarflexion with Janee Morn testing.  Good strength.  Good pulses.  Ambulating without a limp.  Limited MSK ultrasound of the distal left Achilles tendon shows calcification at the insertion of the tendon onto the posterior calcaneus.  There is some hypoechoic fluid in this area as well.  This is best seen on the short axis view.  The majority of her Achilles tendon appears unremarkable.       Assessment & Plan:   Left heel pain secondary to insertional calcific tendinopathy  5/16th  inch heel lift to be worn when walking.  She will start daily [place] heel drop exercises.  She will continue icing.  She may resume recreational walking as her symptoms allow but she will likely miss several weeks of pickleball while this heals.  We discussed starting soundwave treatment in a couple of weeks if she does not respond favorably to today's treatment.  She will follow-up for ongoing or recalcitrant issues  This note was dictated using Dragon naturally speaking software and may contain errors in syntax, spelling, or content which have not been identified prior to signing this note.

## 2021-06-07 ENCOUNTER — Encounter: Payer: Self-pay | Admitting: Internal Medicine

## 2021-06-27 ENCOUNTER — Ambulatory Visit: Payer: 59 | Admitting: Neurology

## 2021-06-27 ENCOUNTER — Encounter: Payer: Self-pay | Admitting: Neurology

## 2021-06-27 VITALS — BP 135/78 | HR 73 | Ht 67.0 in | Wt 159.1 lb

## 2021-06-27 DIAGNOSIS — Z8673 Personal history of transient ischemic attack (TIA), and cerebral infarction without residual deficits: Secondary | ICD-10-CM

## 2021-06-27 NOTE — Progress Notes (Signed)
[facility] [address] [place]. [place] [address] [phone]       OFFICE FOLLOW UP VISIT NOTE  [name] Date of Birth:  [birth year] Medical Record Number:  [id number]   Referring MD: Bufford Spikes, DO Reason for Referral: TIA  HPI: Initial visit 11/17/2019 :Judith Oconnor is a 66 year old pleasant Caucasian lady seen today for initial office consultation visit for speech difficulty.  History is obtained from the patient and review of referral notes and electronic medical records and I personally reviewed available imaging films in PACS.  She has past medical history of anxiety and thrombophlebitis of the legs only.  She had an episode on 09/14/2019 when she was sitting at home her office stable typing on her computer when she noticed she was not typing well and making mistakes.  She got up and she spoke to her son she noticed that she had trouble speaking and the son mentioned that her words were slurred.  There is no obvious facial droop or headache, blurred vision, diplopia, vertigo or obvious extremity weakness or numbness.  When EMT arrived and noticed her blood pressure was elevated in the 170 systolic range.  She was seen at [facility] emergency room where she had an MRI scan of the brain done which showed only few nonspecific white matter hyperintensities with no acute stroke.  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  Patient subsequently had an echocardiogram as an outpatient on 10/03/2019 which showed ejection fraction of 60 to 65% without cardiac source of embolism.  She has not yet started aspirin which she was she was advised to take.  She has changed Lipitor which she was advised to Crestor 5 mg daily which she seems to be tolerating well.  She does have family history of stroke in her maternal  grandmother.  Her LDL cholesterol on 09/14/2019 was 138 mg percent and when repeated on 11/10/2019 it has come down to 68 mg percent.  Her hemoglobin A1c was 4.9.  Patient denies any history of palpitations, syncope, cardiac arrhythmias or history of atrial fibrillation.  She states she is doing well today and has no complaints.  She is working full-time in [employer]. Update 05/28/2020 : She returns for follow-up after last visit 6 months ago.  She is doing well.  She has had no recurrent episodes of speech language difficulties or any other TIA or stroke symptoms.  She remains on aspirin she is tolerating well with without bruising or bleeding.  She is also on Crestor which is tolerating well without muscle aches and pains.  Last lipid profile was on 02/16/2020 and LDL cholesterol was optimal at 68 mg percent, triglycerides 150, HDL 52 and total cholesterol 143 mg percent.  Her blood pressure is usually better at home and today it is borderline in office at 141/69.  She does eat healthy and is quite active and exercise regularly.  She is still working at her [employer] company full-time.  She has no complaints. Update 06/27/2021 ; She returns for follow-up after last visit a year ago.  She is doing very well.  She has had no recurrent TIA or stroke symptoms.  She remains on aspirin she is tolerating well without bruising or bleeding.  She is also on Crestor 5 mg and tolerating it well without muscle aches  and pains.  Last lipid profile on 02/19/2021 showed LDL cholesterol to be optimal at 59 mg percent though triglycerides was slightly elevated 159.  Hemoglobin A1c was 5.3.  Her blood pressure remains under good control.  She has had no new complaints health issues.  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 ROS:   14 system review of systems is positive for no complaints today and all other systems negative  PMH:   Past Medical History:  Diagnosis Date   Anxiety state, unspecified    Contusion of buttock    Hematoma 10/06/10   buttock    Stroke ([place]) 09/14/2019   possible TIA   Symptomatic menopausal or female climacteric states    Thrombophlebitis leg 1990   Varicose vein of leg    Varicose veins     Social History:  Social History   Socioeconomic History   Marital status: Widowed    Spouse name: Not on file   Number of children: Not on file   Years of education: Not on file   Highest education level: Not on file  Occupational History   Occupation: Full Time  Tobacco Use   Smoking status: Never   Smokeless tobacco: Never  Substance and Sexual Activity   Alcohol use: Yes    Alcohol/week: 1.0 - 2.0 standard drink of alcohol    Types: 1 - 2 Glasses of wine per week   Drug use: No   Sexual activity: Not on file  Other Topics Concern   Not on file  Social History Narrative   Lives with BF   Right Handed   Drinks 1 cups caffeine daily   Social Determinants of Health   Financial Resource Strain: Not on file  Food Insecurity: Not on file  Transportation Needs: Not on file  Physical Activity: Not on file  Stress: Not on file  Social Connections: Not on file  Intimate Partner Violence: Not on file    Medications:   Current Outpatient Medications on File Prior to Visit  Medication Sig Dispense Refill   aspirin EC 81 MG tablet Take 81 mg by mouth daily. Swallow whole.     Cholecalciferol (VITAMIN D3) 10 MCG (400 UNIT) CAPS Take by mouth.     rosuvastatin (CRESTOR) 5 MG tablet TAKE 1 TABLET(5 MG) BY MOUTH AT BEDTIME 90 tablet 1   Current Facility-Administered Medications on File Prior to Visit  Medication Dose Route Frequency Provider Last Rate Last Admin   sodium chloride flush (NS) 0.9 % injection 10 mL  10 mL Intravenous PRN Reed, Tiffany L, DO   10 mL at 10/03/19 1335    Allergies:  No Known Allergies  Physical Exam General: well developed, well nourished pleasant  middle-age Caucasian lady, seated, in no evident distress Head: head normocephalic and atraumatic.   Neck: supple with no carotid or supraclavicular bruits Cardiovascular: regular rate and rhythm, no murmurs Musculoskeletal: no deformity Skin:  no rash/petichiae Vascular:  Normal pulses all extremities  Neurologic Exam Mental Status: Awake and fully alert. Oriented to place and time. Recent and remote memory intact. Attention span, concentration and fund of knowledge appropriate. Mood and affect appropriate.  Cranial Nerves: Fundoscopic exam not done.. Pupils equal, briskly reactive to light. Extraocular movements full without nystagmus. Visual fields full to confrontation. Hearing intact. Facial sensation intact. Face, tongue, palate moves normally and symmetrically.  Motor: Normal bulk and tone. Normal  strength in all tested extremity muscles. Sensory.: intact to touch , pinprick , position and vibratory sensation.  Coordination: Rapid alternating movements normal in all extremities. Finger-to-nose and heel-to-shin performed accurately bilaterally. Gait and Station: Arises from chair without difficulty. Stance is normal. Gait demonstrates normal stride length and balance . Able to heel, toe and tandem walk without difficulty.  Reflexes: 1+ and symmetric. Toes downgoing.      ASSESSMENT:.  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  Vascular risk factors of hyperlipidemia and borderline hypertension.  She remains stable from neurovascular standpoint.     PLAN: II had a long d/w patient about her remote TIA, risk for recurrent stroke/TIAs, personally independently reviewed imaging studies and stroke evaluation results and answered questions.Continue aspirin 81 mg daily  for secondary stroke prevention and maintain strict control of hypertension with blood  pressure goal below 130/90, diabetes with hemoglobin A1c goal below 6.5% and lipids with LDL cholesterol goal below 70 mg/dL. I also advised the patient to eat a healthy diet with plenty of whole grains, cereals, fruits and vegetables, exercise regularly and maintain ideal body weight . Check screening f/u carotid ultrasound.Followup in the future with me only as needed.  Greater than 50% time during this 35-minute  visit was spent on counseling and coordination of care about her TIA and discussion about prevention and treatment and answering questions Delia Heady, MD Note: This document was prepared with digital dictation and possible smart phrase technology. Any transcriptional errors that result from this process are unintentional.

## 2021-06-27 NOTE — Patient Instructions (Signed)
I had a long d/w patient about her remote TIA, risk for recurrent stroke/TIAs, personally independently reviewed imaging studies and stroke evaluation results and answered questions.Continue aspirin 81 mg daily  for secondary stroke prevention and maintain strict control of hypertension with blood pressure goal below 130/90, diabetes with hemoglobin A1c goal below 6.5% and lipids with LDL cholesterol goal below 70 mg/dL. I also advised the patient to eat a healthy diet with plenty of whole grains, cereals, fruits and vegetables, exercise regularly and maintain ideal body weight . Check screening f/u carotid ultrasound.Followup in the future with me only as needed.

## 2021-07-26 ENCOUNTER — Other Ambulatory Visit: Payer: Self-pay | Admitting: Family

## 2021-07-26 DIAGNOSIS — G459 Transient cerebral ischemic attack, unspecified: Secondary | ICD-10-CM

## 2021-07-26 DIAGNOSIS — E785 Hyperlipidemia, unspecified: Secondary | ICD-10-CM

## 2021-07-31 ENCOUNTER — Encounter: Payer: Self-pay | Admitting: Internal Medicine

## 2021-08-05 ENCOUNTER — Ambulatory Visit (HOSPITAL_COMMUNITY)
Admission: RE | Admit: 2021-08-05 | Discharge: 2021-08-05 | Disposition: A | Discharge status: Home / Self Care | Payer: 59 | Source: Ambulatory Visit | Attending: Neurology | Admitting: Neurology

## 2021-08-05 DIAGNOSIS — Z8673 Personal history of transient ischemic attack (TIA), and cerebral infarction without residual deficits: Secondary | ICD-10-CM | POA: Insufficient documentation

## 2021-08-05 NOTE — Progress Notes (Signed)
Kindly inform the patient that carotid ultrasound study does not show significant blockage at either carotid bifurcation in the neck.

## 2021-08-06 ENCOUNTER — Encounter: Payer: Self-pay | Admitting: *Deleted

## 2021-08-26 ENCOUNTER — Other Ambulatory Visit: Payer: 59

## 2021-08-29 ENCOUNTER — Ambulatory Visit: Payer: 59 | Admitting: Family

## 2021-09-02 ENCOUNTER — Ambulatory Visit (AMBULATORY_SURGERY_CENTER): Payer: Self-pay | Admitting: *Deleted

## 2021-09-02 VITALS — Ht 67.0 in | Wt 159.8 lb

## 2021-09-02 DIAGNOSIS — Z1211 Encounter for screening for malignant neoplasm of colon: Secondary | ICD-10-CM

## 2021-09-02 NOTE — Progress Notes (Signed)
  No trouble with anesthesia, denies being told they were difficult to intubate, or hx/fam hx of malignant hyperthermia per pt   No egg or soy allergy  No home oxygen use   No medications for weight loss taken  emmi information given  Pt denies constipation issues  

## 2021-09-18 ENCOUNTER — Other Ambulatory Visit: Payer: PPO

## 2021-09-18 DIAGNOSIS — E785 Hyperlipidemia, unspecified: Secondary | ICD-10-CM | POA: Diagnosis not present

## 2021-09-18 DIAGNOSIS — R002 Palpitations: Secondary | ICD-10-CM | POA: Diagnosis not present

## 2021-09-19 LAB — CBC WITH DIFFERENTIAL/PLATELET
Absolute Monocytes: 416 cells/uL (ref 200–950)
Basophils Absolute: 92 cells/uL (ref 0–200)
Basophils Relative: 1.4 %
Eosinophils Absolute: 271 cells/uL (ref 15–500)
Eosinophils Relative: 4.1 %
HCT: 38 % (ref 35.0–45.0)
Hemoglobin: 13 g/dL (ref 11.7–15.5)
Lymphs Abs: 2046 cells/uL (ref 850–3900)
MCH: 30 pg (ref 27.0–33.0)
MCHC: 34.2 g/dL (ref 32.0–36.0)
MCV: 87.6 fL (ref 80.0–100.0)
MPV: 10.1 fL (ref 7.5–12.5)
Monocytes Relative: 6.3 %
Neutro Abs: 3775 cells/uL (ref 1500–7800)
Neutrophils Relative %: 57.2 %
Platelets: 235 10*3/uL (ref 140–400)
RBC: 4.34 10*6/uL (ref 3.80–5.10)
RDW: 13.4 % (ref 11.0–15.0)
Total Lymphocyte: 31 %
WBC: 6.6 10*3/uL (ref 3.8–10.8)

## 2021-09-19 LAB — LIPID PANEL
Cholesterol: 134 mg/dL (ref <200)
HDL: 52 mg/dL (ref >=50)
LDL Cholesterol (Calc): 57 mg/dL (calc)
Non-HDL Cholesterol (Calc): 82 mg/dL (calc) (ref <130)
Total CHOL/HDL Ratio: 2.6 (calc) (ref <5.0)
Triglycerides: 170 mg/dL — ABNORMAL HIGH (ref <150)

## 2021-09-19 LAB — BASIC METABOLIC PANEL WITH GFR
BUN: 15 mg/dL (ref 7–25)
CO2: 25 mmol/L (ref 20–32)
Calcium: 9.3 mg/dL (ref 8.6–10.4)
Chloride: 107 mmol/L (ref 98–110)
Creat: 0.72 mg/dL (ref 0.50–1.05)
Glucose, Bld: 97 mg/dL (ref 65–99)
Potassium: 4.3 mmol/L (ref 3.5–5.3)
Sodium: 142 mmol/L (ref 135–146)
eGFR: 93 mL/min/{1.73_m2} (ref >=60)

## 2021-09-30 ENCOUNTER — Encounter: Payer: Self-pay | Admitting: Internal Medicine

## 2021-10-01 ENCOUNTER — Ambulatory Visit: Payer: PPO | Admitting: Family

## 2021-10-01 ENCOUNTER — Encounter: Payer: Self-pay | Admitting: Family

## 2021-10-01 VITALS — BP 128/86 | HR 77 | Temp 97.5°F | Resp 16 | Ht 67.0 in | Wt 163.4 lb

## 2021-10-01 DIAGNOSIS — E785 Hyperlipidemia, unspecified: Secondary | ICD-10-CM | POA: Diagnosis not present

## 2021-10-01 DIAGNOSIS — R7303 Prediabetes: Secondary | ICD-10-CM | POA: Diagnosis not present

## 2021-10-01 DIAGNOSIS — Z8673 Personal history of transient ischemic attack (TIA), and cerebral infarction without residual deficits: Secondary | ICD-10-CM | POA: Diagnosis not present

## 2021-10-01 NOTE — Progress Notes (Signed)
Provider: Marlowe Sax FNP-C   Addysyn Fern, Nelda Bucks, NP  Patient Care Team: Dacotah Cabello, Nelda Bucks, NP as PCP - General (Family Medicine) Mal Misty, MD (Inactive) as Consulting Physician (Vascular Surgery) Maisie Fus, MD (Inactive) as Consulting Physician (Obstetrics and Gynecology) Richmond Campbell, MD as Consulting Physician (Gastroenterology) Johnathan Hausen, MD as Consulting Physician (General Surgery) Clent Jacks, MD as Consulting Physician (Ophthalmology) Thurman Coyer, DO as Consulting Physician (Sports Medicine)  Extended Emergency Contact Information Primary Emergency Contact: Elliott,Ken Address: [place], [place] [address] Johnnet[place] of [place] Phone: [phone] Relation: Significant other Secondary Emergency Contact: Griffee,Julia Address: [address]          [place], [place] [address] [place] of [place] Phone: [phone] Relation: Daughter Father: Montel Culver, [place] of [place] Phone: [phone]  Code Status:  Full Code  Goals of care: Advanced Directive information    10/01/2021    9:14 AM  Advanced Directives  Does Patient Have a Medical Advance Directive? No  Would patient like information on creating a medical advance directive? No - Patient declined     Chief Complaint  Patient presents with   Medical Management of Chronic Issues    6 month follow up.   Health Maintenance    Discuss the need for HIV screening, Hepatitis C Screening, Dexa scan, and Colonoscopy.   Immunizations    Discuss the need for Covid Booster, Pne vaccine, and Influenza vaccine.    HPI:  Pt is a 66 y.o. female seen today for 6 months follow up for medical management of chronic diseases.  She denies any acute issues today.  [facility]  States will get her Influenza vaccine in October.she will also get her COVID-19 booster and RSV at the  pharmacy.   Her colonoscopy is scheduled for 10/29/2021.  Denies any abdominal pain or blood in the stool.  Recent lab results reviewed and discussed during visit.labs unremarkable except  TRG 170   Has had 3 lbs weight gain since last visit.States just return from vacation " turned 66 yrs" went out to celebrate with friends. She will resume her exercise regimen.walks 3 miles three times per week.strengthening twice per week.   Past Medical History:  Diagnosis Date   Anxiety state, unspecified    pt unsure and denies this   Contusion of buttock    DVT (deep vein thrombosis) in pregnancy    1990- none since then   Hematoma 10/06/2010   buttock    Hyperlipidemia    Stroke ([place]) 09/14/2019   possible TIA   Symptomatic menopausal or female climacteric states    Thrombophlebitis leg 1990   Varicose vein of leg    Varicose veins    Past Surgical History:  Procedure Laterality Date   CHOLECYSTECTOMY  December 1990   Dr.Matt Martin   COLONOSCOPY  09/14/2008   Diverticulosis, Dr.Medoff    ENDOMETRIAL ABLATION  2006   [place] VEIN SURGERY Left 10/12/2013   [facility]    No Known Allergies  Allergies as of 10/01/2021   No Known Allergies      Medication List        Accurate as of October 01, 2021  9:28 AM. If you have any questions, ask your nurse or doctor.  aspirin EC 81 MG tablet Take 81 mg by mouth daily. Swallow whole.   rosuvastatin 5 MG tablet Commonly known as: CRESTOR TAKE ONE TABLET BY MOUTH EVERY NIGHT AT BEDTIME   Vitamin D3 10 MCG (400 UNIT) Caps Take by mouth.        Review of Systems  Constitutional:  Negative for appetite change, chills, fatigue, fever and unexpected weight change.  HENT:  Negative for congestion, dental problem, ear discharge, ear pain, facial swelling, hearing loss, nosebleeds, postnasal drip, rhinorrhea, sinus pressure, sinus pain, sneezing, sore throat, tinnitus and trouble  swallowing.   Eyes:  Negative for pain, discharge, redness, itching and visual disturbance.  Respiratory:  Negative for cough, chest tightness, shortness of breath and wheezing.   Cardiovascular:  Negative for chest pain, palpitations and leg swelling.  Gastrointestinal:  Negative for abdominal distention, abdominal pain, blood in stool, constipation, diarrhea, nausea and vomiting.  Endocrine: Negative for cold intolerance, heat intolerance, polydipsia, polyphagia and polyuria.  Genitourinary:  Negative for difficulty urinating, dysuria, flank pain, frequency and urgency.  Musculoskeletal:  Negative for arthralgias, back pain, gait problem, joint swelling, myalgias, neck pain and neck stiffness.  Skin:  Negative for color change, pallor, rash and wound.  Neurological:  Negative for dizziness, syncope, speech difficulty, weakness, light-headedness, numbness and headaches.  Hematological:  Does not bruise/bleed easily.  Psychiatric/Behavioral:  Negative for agitation, behavioral problems, confusion, hallucinations, self-injury, sleep disturbance and suicidal ideas. The patient is not nervous/anxious.     Immunization History  Administered Date(s) Administered   Influenza Inj Mdck Quad Pf 10/28/2017   Influenza, Quadrivalent, Recombinant, Inj, Pf 10/28/2017   Influenza,inj,Quad PF,6+ Mos 11/30/2012, 01/03/2015, 10/24/2015, 12/16/2016, 10/22/2018, 09/29/2019   Influenza-Unspecified 11/19/2006, 10/11/2007, 11/25/2011, 10/17/2013, 10/12/2020   PFIZER(Purple Top)SARS-COV-2 Vaccination 01/30/2019, 02/17/2019, 11/04/2019, 10/03/2020   Tdap 11/25/2011   Zoster Recombinat (Shingrix) 03/22/2021, 09/06/2021   Pertinent  Health Maintenance Due  Topic Date Due   DEXA SCAN  06/12/2018   COLONOSCOPY (Pts [id number] Insurance coverage will need to be confirmed)  09/15/2018   INFLUENZA VACCINE  08/13/2021   PAP SMEAR-Modifier  11/14/2021   MAMMOGRAM  12/12/2022      09/14/2019    5:40 PM 09/29/2019     8:12 AM 02/20/2020    8:53 AM 02/21/2021    8:33 AM 10/01/2021    9:13 AM  Fall Risk  Falls in the past year?  0 0 0 0  Was there an injury with Fall?  0 0 0 0  Fall Risk Category Calculator  0 0 0 0  Fall Risk Category  Low Low Low Low  Patient Fall Risk Level Low fall risk Low fall risk  Low fall risk Low fall risk  Patient at Risk for Falls Due to    No Fall Risks No Fall Risks  Fall risk Follow up    Falls evaluation completed;Education provided;Falls prevention discussed Falls evaluation completed   Functional Status Survey:    Vitals:   10/01/21 0910  BP: 128/86  Pulse: 77  Resp: 16  Temp: (!) 97.5 F (36.4 C)  SpO2: 99%  Weight: 163 lb 6.4 oz (74.1 kg)  Height: 5\' 7"  (1.702 m)   Body mass index is 25.59 kg/m. Physical Exam Vitals reviewed.  Constitutional:      General: She is not in acute distress.    Appearance: Normal appearance. She is overweight. She is not ill-appearing or diaphoretic.  HENT:     Head: Normocephalic.     Right Ear: Tympanic membrane,  ear canal and external ear normal. There is no impacted cerumen.     Left Ear: Tympanic membrane, ear canal and external ear normal. There is no impacted cerumen.     Nose: Nose normal. No congestion or rhinorrhea.     Mouth/Throat:     Mouth: Mucous membranes are moist.     Pharynx: Oropharynx is clear. No oropharyngeal exudate or posterior oropharyngeal erythema.  Eyes:     General: No scleral icterus.       Right eye: No discharge.        Left eye: No discharge.     Extraocular Movements: Extraocular movements intact.     Conjunctiva/sclera: Conjunctivae normal.     Pupils: Pupils are equal, round, and reactive to light.  Neck:     Vascular: No carotid bruit.  Cardiovascular:     Rate and Rhythm: Normal rate and regular rhythm.     Pulses: Normal pulses.     Heart sounds: Normal heart sounds. No murmur heard.    No friction rub. No gallop.  Pulmonary:     Effort: Pulmonary effort is normal. No  respiratory distress.     Breath sounds: Normal breath sounds. No wheezing, rhonchi or rales.  Chest:     Chest wall: No tenderness.  Abdominal:     General: Bowel sounds are normal. There is no distension.     Palpations: Abdomen is soft. There is no mass.     Tenderness: There is no abdominal tenderness. There is no right CVA tenderness, left CVA tenderness, guarding or rebound.  Musculoskeletal:        General: No swelling or tenderness. Normal range of motion.     Cervical back: Normal range of motion. No rigidity or tenderness.     Right lower leg: No edema.     Left lower leg: No edema.  Lymphadenopathy:     Cervical: No cervical adenopathy.  Skin:    General: Skin is warm and dry.     Coloration: Skin is not pale.     Findings: No bruising, erythema, lesion or rash.  Neurological:     Mental Status: She is alert and oriented to person, place, and time.     Cranial Nerves: No cranial nerve deficit.     Sensory: No sensory deficit.     Motor: No weakness.     Coordination: Coordination normal.     Gait: Gait normal.  Psychiatric:        Mood and Affect: Mood normal.        Speech: Speech normal.        Behavior: Behavior normal.        Thought Content: Thought content normal.        Judgment: Judgment normal.    Labs reviewed: Recent Labs    02/19/21 0807 09/18/21 0821  NA 142 142  K 4.7 4.3  CL 106 107  CO2 29 25  GLUCOSE 105* 97  BUN 14 15  CREATININE 0.72 0.72  CALCIUM 9.5 9.3   Recent Labs    02/19/21 0807  AST 14  ALT 20  BILITOT 0.4  PROT 6.3   Recent Labs    02/19/21 0807 09/18/21 0821  WBC 6.5 6.6  NEUTROABS 3,809 3,775  HGB 12.9 13.0  HCT 38.7 38.0  MCV 89.6 87.6  PLT 240 235   Lab Results  Component Value Date   TSH 2.97 02/19/2021   Lab Results  Component Value Date   HGBA1C 5.3  02/19/2021   Lab Results  Component Value Date   CHOL 134 09/18/2021   HDL 52 09/18/2021   LDLCALC 57 09/18/2021   TRIG 170 (H) 09/18/2021    CHOLHDL 2.6 09/18/2021    Significant Diagnostic Results in last 30 days:  No results found.  Assessment/Plan  1. Dyslipidemia TRG170 - continue on Crestor -Continue dietary modification and exercise  2. Prediabetes Lab Results  Component Value Date   HGBA1C 5.3 02/19/2021  Has improved Continue with dietary modification and exercise  3. History of TIA (transient ischemic attack) Continue on cholesterol and aspirin  Family/ staff Communication: Reviewed plan of care with patient verbalized understanding  Labs/tests ordered: None   Next Appointment : Return in about 5 months (around 02/21/2022) for annual Physical examination.   Caesar Bookman, NP

## 2021-10-29 ENCOUNTER — Ambulatory Visit (AMBULATORY_SURGERY_CENTER): Payer: PPO | Admitting: Internal Medicine

## 2021-10-29 ENCOUNTER — Encounter: Payer: Self-pay | Admitting: Internal Medicine

## 2021-10-29 VITALS — BP 116/56 | HR 62 | Temp 97.3°F | Resp 12 | Ht 67.0 in | Wt 159.8 lb

## 2021-10-29 DIAGNOSIS — Z1211 Encounter for screening for malignant neoplasm of colon: Secondary | ICD-10-CM | POA: Diagnosis not present

## 2021-10-29 MED ORDER — SODIUM CHLORIDE 0.9 % IV SOLN: 500.0000 mL | INTRAVENOUS | Status: DC

## 2021-10-29 NOTE — Op Note (Signed)
[facility] Patient Name: Judith Oconnor Procedure Date: 10/29/2021 11:41 AM MRN: [id number] Endoscopist: Iva Boop , MD Age: 66 Referring MD:  Date of Birth: 05/19/1955 Gender: Female Account #: [id number] Procedure:                Colonoscopy Indications:              Screening for colorectal malignant neoplasm, Last                            colonoscopy: 2010 Medicines:                Monitored Anesthesia Care Procedure:                Pre-Anesthesia Assessment:                           - Prior to the procedure, a History and Physical                            was performed, and patient medications and                            allergies were reviewed. The patient's tolerance of                            previous anesthesia was also reviewed. The risks                            and benefits of the procedure and the sedation                            options and risks were discussed with the patient.                            All questions were answered, and informed consent                            was obtained. Prior Anticoagulants: The patient has                            taken no previous anticoagulant or antiplatelet                            agents. ASA Grade Assessment: II - A patient with                            mild systemic disease. After reviewing the risks                            and benefits, the patient was deemed in                            satisfactory condition to undergo the procedure.  After obtaining informed consent, the colonoscope                            was passed under direct vision. Throughout the                            procedure, the patient's blood pressure, pulse, and                            oxygen saturations were monitored continuously. The                            Olympus Scope [id number] was introduced through the                            anus and advanced to the the cecum,  identified by                            appendiceal orifice and ileocecal valve. The                            colonoscopy was somewhat difficult due to                            significant looping. Successful completion of the                            procedure was aided by applying abdominal pressure.                            The patient tolerated the procedure well. The                            quality of the bowel preparation was good. The                            ileocecal valve, appendiceal orifice, and rectum                            were photographed. The bowel preparation used was                            Miralax via split dose instruction. Scope In: 11:54:17 AM Scope Out: 12:10:46 PM Scope Withdrawal Time: 0 hours 9 minutes 18 seconds  Total Procedure Duration: 0 hours 16 minutes 29 seconds  Findings:                 The perianal and digital rectal examinations were                            normal.                           Multiple diverticula were found in the sigmoid  colon, descending colon and transverse colon.                           The exam was otherwise without abnormality on                            direct and retroflexion views. Complications:            No immediate complications. Estimated Blood Loss:     Estimated blood loss: none. Impression:               - Diverticulosis in the sigmoid colon, in the                            descending colon and in the transverse colon.                           - The examination was otherwise normal on direct                            and retroflexion views.                           - No specimens collected. Recommendation:           - Patient has a contact number available for                            emergencies. The signs and symptoms of potential                            delayed complications were discussed with the                            patient. Return to normal  activities tomorrow.                            Written discharge instructions were provided to the                            patient.                           - Resume previous diet.                           - Continue present medications.                           - Repeat colonoscopy in 10 years for screening                            purposes. Judith Mayer, MD 10/29/2021 12:22:02 PM This report has been signed electronically.

## 2021-10-29 NOTE — Progress Notes (Signed)
[facility] History and Physical   Primary Care Physician:  Ngetich, Nelda Bucks, NP   Reason for Procedure:   CRCA screening  Plan:    colonoscopy     HPI: Judith Oconnor is a 66 y.o. female for screening exam   Past Medical History:  Diagnosis Date   Anxiety state, unspecified    pt unsure and denies this   Contusion of buttock    DVT (deep vein thrombosis) in pregnancy    1990- none since then   Hematoma 10/06/2010   buttock    Hyperlipidemia    Stroke ([place]) 09/14/2019   possible TIA   Symptomatic menopausal or female climacteric states    Thrombophlebitis leg 1990   Varicose vein of leg    Varicose veins     Past Surgical History:  Procedure Laterality Date   CHOLECYSTECTOMY  December 1990   Dr.Matt Martin   COLONOSCOPY  09/14/2008   Diverticulosis, Dr.Medoff    ENDOMETRIAL ABLATION  2006   [place] Left 10/12/2013   [facility]    Prior to Admission medications   Medication Sig Start Date End Date Taking? Authorizing Provider  aspirin EC 81 MG tablet Take 81 mg by mouth daily. Swallow whole.   Yes [provider]  Cholecalciferol (VITAMIN D3) 10 MCG (400 UNIT) CAPS Take by mouth.   Yes [provider]  rosuvastatin (CRESTOR) 5 MG tablet TAKE ONE TABLET BY MOUTH EVERY NIGHT AT BEDTIME 07/29/21  Yes Ngetich, Dinah C, NP    Current Outpatient Medications  Medication Sig Dispense Refill   aspirin EC 81 MG tablet Take 81 mg by mouth daily. Swallow whole.     Cholecalciferol (VITAMIN D3) 10 MCG (400 UNIT) CAPS Take by mouth.     rosuvastatin (CRESTOR) 5 MG tablet TAKE ONE TABLET BY MOUTH EVERY NIGHT AT BEDTIME 90 tablet 3   Current Facility-Administered Medications  Medication Dose Route Frequency Provider Last Rate Last Admin   0.9 %  sodium chloride infusion  500 mL Intravenous Continuous [name], MD       Facility-Administered Medications Ordered in Other Visits  Medication Dose  Route Frequency Provider Last Rate Last Admin   sodium chloride flush (NS) 0.9 % injection 10 mL  10 mL Intravenous PRN Reed, Tiffany L, DO   10 mL at 10/03/19 1335    Allergies as of 10/29/2021   (Not on File)    Family History  Problem Relation Age of Onset   Macular degeneration Mother    Coronary artery disease Father    Breast cancer Neg Hx    Colon cancer Neg Hx    Esophageal cancer Neg Hx    Rectal cancer Neg Hx    Stomach cancer Neg Hx     Social History   Socioeconomic History   Marital status: Widowed    Spouse name: Not on file   Number of children: Not on file   Years of education: Not on file   Highest education level: Not on file  Occupational History   Occupation: Full Time  Tobacco Use   Smoking status: Never   Smokeless tobacco: Never  Vaping Use   Vaping Use: Never used  Substance and Sexual Activity   Alcohol use: Yes    Alcohol/week: 1.0 - 2.0 standard drink of alcohol    Types: 1 - 2 Glasses of wine per week   Drug use: No   Sexual activity:  Yes    Birth control/protection: Surgical    Comment: ablation  Other Topics Concern   Not on file  Social History Narrative   Lives with BF   Right Handed   Drinks 1 cups caffeine daily   Social Determinants of Health   Financial Resource Strain: Not on file  Food Insecurity: Not on file  Transportation Needs: Not on file  Physical Activity: Not on file  Stress: Not on file  Social Connections: Not on file  Intimate Partner Violence: Not on file    Review of Systems:  All other review of systems negative except as mentioned in the HPI.  Physical Exam: Vital signs BP 128/67   Pulse 68   Temp (!) 97.3 F (36.3 C)   Ht 5\' 7"  (1.702 m)   Wt 159 lb 12.8 oz (72.5 kg)   SpO2 100%   BMI 25.03 kg/m   General:   Alert,  Well-developed, well-nourished, pleasant and cooperative in NAD Lungs:  Clear throughout to auscultation.   Heart:  Regular rate and rhythm; no murmurs, clicks, rubs,  or  gallops. Abdomen:  Soft, nontender and nondistended. Normal bowel sounds.   Neuro/Psych:  Alert and cooperative. Normal mood and affect. A and O x 3   @Mertha Clyatt  , MD, [facility] [phone] (pager) 10/29/2021 11:41 AM@

## 2021-10-29 NOTE — Patient Instructions (Addendum)
I did not find any polyps or cancer.  You do have diverticulosis - thickened muscle rings and pouches in the colon wall. Please read the handout about this condition.  Next routine colonoscopy or other screening test in 10 years - 2033.  I appreciate the opportunity to care for you. [name], MD, [facility]   Resume previous diet and medications.  YOU HAD AN ENDOSCOPIC PROCEDURE TODAY AT [facility]:   Refer to the procedure report that was given to you for any specific questions about what was found during the examination.  If the procedure report does not answer your questions, please call your gastroenterologist to clarify.  If you requested that your care partner not be given the details of your procedure findings, then the procedure report has been included in a sealed envelope for you to review at your convenience later.  YOU SHOULD EXPECT: Some feelings of bloating in the abdomen. Passage of more gas than usual.  Walking can help get rid of the air that was put into your GI tract during the procedure and reduce the bloating. If you had a lower endoscopy (such as a colonoscopy or flexible sigmoidoscopy) you may notice spotting of blood in your stool or on the toilet paper. If you underwent a bowel prep for your procedure, you may not have a normal bowel movement for a few days.  Please Note:  You might notice some irritation and congestion in your nose or some drainage.  This is from the oxygen used during your procedure.  There is no need for concern and it should clear up in a day or so.  SYMPTOMS TO REPORT IMMEDIATELY:  Following lower endoscopy (colonoscopy or flexible sigmoidoscopy):  Excessive amounts of blood in the stool  Significant tenderness or worsening of abdominal pains  Swelling of the abdomen that is new, acute  Fever of 100F or higher   For urgent or emergent issues, a gastroenterologist can be reached at any hour by calling [phone]. Do not  use MyChart messaging for urgent concerns.    DIET:  We do recommend a small meal at first, but then you may proceed to your regular diet.  Drink plenty of fluids but you should avoid alcoholic beverages for 24 hours.  ACTIVITY:  You should plan to take it easy for the rest of today and you should NOT DRIVE or use heavy machinery until tomorrow (because of the sedation medicines used during the test).    FOLLOW UP: Our staff will call the number listed on your records the next business day following your procedure.  We will call around 7:15- 8:00 am to check on you and address any questions or concerns that you may have regarding the information given to you following your procedure. If we do not reach you, we will leave a message.     If any biopsies were taken you will be contacted by phone or by letter within the next 1-3 weeks.  Please call us at [phone] if you have not heard about the biopsies in 3 weeks.    SIGNATURES/CONFIDENTIALITY: You and/or your care partner have signed paperwork which will be entered into your electronic medical record.  These signatures attest to the fact that that the information above on your After Visit Summary has been reviewed and is understood.  Full responsibility of the confidentiality of this discharge information lies with you and/or your care-partner.

## 2021-10-29 NOTE — Progress Notes (Signed)
Pt's states no medical or surgical changes since previsit or office visit. 

## 2021-10-29 NOTE — Progress Notes (Signed)
Pt resting comfortably. VSS. Airway intact. SBAR complete to RN. All questions answered.   

## 2021-10-30 ENCOUNTER — Telehealth: Payer: Self-pay | Admitting: *Deleted

## 2021-10-30 NOTE — Telephone Encounter (Signed)
Left message on f/u call 

## 2022-01-16 ENCOUNTER — Encounter: Payer: Self-pay | Admitting: Adult Health

## 2022-01-16 ENCOUNTER — Ambulatory Visit (INDEPENDENT_AMBULATORY_CARE_PROVIDER_SITE_OTHER): Payer: PPO | Admitting: Adult Health

## 2022-01-16 VITALS — BP 148/90 | HR 80 | Temp 98.2°F | Ht 67.0 in | Wt 162.8 lb

## 2022-01-16 DIAGNOSIS — J069 Acute upper respiratory infection, unspecified: Secondary | ICD-10-CM

## 2022-01-16 MED ORDER — PREDNISONE 20 MG PO TABS: 20.0000 mg | ORAL_TABLET | Freq: Every day | ORAL | 0 refills | Status: AC

## 2022-01-16 MED ORDER — DOXYCYCLINE HYCLATE 100 MG PO TABS: 100.0000 mg | ORAL_TABLET | Freq: Two times a day (BID) | ORAL | 0 refills | Status: AC

## 2022-01-16 MED ORDER — BENZONATATE 100 MG PO CAPS: 100.0000 mg | ORAL_CAPSULE | Freq: Three times a day (TID) | ORAL | 0 refills | Status: DC | PRN

## 2022-01-16 NOTE — Progress Notes (Signed)
[facility] clinic  Provider:  Durenda Age DNP  Code Status:  Full Code  Goals of Care:     10/01/2021    9:14 AM  Advanced Directives  Does Patient Have a Medical Advance Directive? No  Would patient like information on creating a medical advance directive? No - Patient declined     Chief Complaint  Patient presents with   Acute Visit    Patient presents today for cough w/ mucus since 12/25/21 off/ on. She reports negative COVID test.    HPI: Patient is a 66 y.o. female seen today for an acute visit for productive cough since 12/25/21. She wakes up coughing at night and noted that her phlegm is grayish. She tested negative for COVID-19 home test. She denies fever nor chills. She has a total of 4 COVID-19 vaccines.  Past Medical History:  Diagnosis Date   Anxiety state, unspecified    pt unsure and denies this   Contusion of buttock    DVT (deep vein thrombosis) in pregnancy    1990- none since then   Hematoma 10/06/2010   buttock    Hyperlipidemia    Stroke ([place]) 09/14/2019   possible TIA   Symptomatic menopausal or female climacteric states    Thrombophlebitis leg 1990   Varicose vein of leg    Varicose veins     Past Surgical History:  Procedure Laterality Date   CHOLECYSTECTOMY  December 1990   Dr.Matt Martin   COLONOSCOPY  09/14/2008   Diverticulosis, Dr.Medoff    ENDOMETRIAL ABLATION  2006   [place] VEIN SURGERY Left 10/12/2013   [facility]    No Known Allergies  Outpatient Encounter Medications as of 01/16/2022  Medication Sig   aspirin EC 81 MG tablet Take 81 mg by mouth daily. Swallow whole.   Cholecalciferol (VITAMIN D3) 10 MCG (400 UNIT) CAPS Take by mouth.   rosuvastatin (CRESTOR) 5 MG tablet TAKE ONE TABLET BY MOUTH EVERY NIGHT AT BEDTIME   Facility-Administered Encounter Medications as of 01/16/2022  Medication   sodium chloride flush (NS) 0.9 % injection 10 mL    Review of Systems:  Review of Systems   Constitutional:  Negative for appetite change, chills, fatigue and fever.  HENT:  Negative for congestion, hearing loss, rhinorrhea and sore throat.   Eyes: Negative.   Respiratory:  Positive for cough. Negative for shortness of breath and wheezing.        Productive cough with grayish phlegm   Cardiovascular:  Negative for chest pain, palpitations and leg swelling.  Gastrointestinal:  Negative for abdominal pain, constipation, diarrhea, nausea and vomiting.  Genitourinary:  Negative for dysuria.  Musculoskeletal:  Negative for arthralgias, back pain and myalgias.  Skin:  Negative for color change, rash and wound.  Neurological:  Negative for dizziness, weakness and headaches.  Psychiatric/Behavioral:  Negative for behavioral problems. The patient is not nervous/anxious.     Health Maintenance  Topic Date Due   Medicare Annual Wellness (AWV)  Never done   HIV Screening  Never done   Hepatitis C Screening  Never done   DEXA SCAN  06/12/2018   INFLUENZA VACCINE  08/13/2021   COVID-19 Vaccine (5 - 2023-24 season) 09/13/2021   Pneumonia Vaccine 62+ Years old (1 - PCV) Never done   PAP SMEAR-Modifier  11/14/2021   DTaP/Tdap/Td (2 - Td or Tdap) 11/24/2021   MAMMOGRAM  12/12/2022   COLONOSCOPY (Pts 45-40yrs Insurance coverage will need to be confirmed)  10/30/2031   Zoster Vaccines- Shingrix  Completed   HPV VACCINES  Aged Out    Physical Exam: Vitals:   01/16/22 0950  Weight: 162 lb 12.8 oz (73.8 kg)  Height: 5\' 7"  (1.702 m)   Body mass index is 25.5 kg/m. Physical Exam Constitutional:      Appearance: Normal appearance.  HENT:     Head: Normocephalic and atraumatic.     Nose: Nose normal.     Mouth/Throat:     Mouth: Mucous membranes are moist.  Eyes:     Conjunctiva/sclera: Conjunctivae normal.  Cardiovascular:     Rate and Rhythm: Normal rate and regular rhythm.  Pulmonary:     Effort: Pulmonary effort is normal.     Breath sounds: Normal breath sounds.   Abdominal:     General: Bowel sounds are normal.     Palpations: Abdomen is soft.  Musculoskeletal:        General: Normal range of motion.     Cervical back: Normal range of motion.  Skin:    General: Skin is warm and dry.  Neurological:     General: No focal deficit present.     Mental Status: She is alert and oriented to person, place, and time.  Psychiatric:        Mood and Affect: Mood normal.        Behavior: Behavior normal.        Thought Content: Thought content normal.        Judgment: Judgment normal.     Labs reviewed: Basic Metabolic Panel: Recent Labs    02/19/21 0807 09/18/21 0821  NA 142 142  K 4.7 4.3  CL 106 107  CO2 29 25  GLUCOSE 105* 97  BUN 14 15  CREATININE 0.72 0.72  CALCIUM 9.5 9.3  TSH 2.97  --    Liver Function Tests: Recent Labs    02/19/21 0807  AST 14  ALT 20  BILITOT 0.4  PROT 6.3   No results for input(s): "LIPASE", "AMYLASE" in the last 8760 hours. No results for input(s): "AMMONIA" in the last 8760 hours. CBC: Recent Labs    02/19/21 0807 09/18/21 0821  WBC 6.5 6.6  NEUTROABS 3,809 3,775  HGB 12.9 13.0  HCT 38.7 38.0  MCV 89.6 87.6  PLT 240 235   Lipid Panel: Recent Labs    02/19/21 0807 09/18/21 0821  CHOL 129 134  HDL 46* 52  LDLCALC 59 57  TRIG 159* 170*  CHOLHDL 2.8 2.6   Lab Results  Component Value Date   HGBA1C 5.3 02/19/2021    Procedures since last visit: No results found.  Assessment/Plan  1. Acute URI - doxycycline (VIBRA-TABS) 100 MG tablet; Take 1 tablet (100 mg total) by mouth 2 (two) times daily for 7 days.  Dispense: 14 tablet; Refill: 0 - benzonatate (TESSALON PERLES) 100 MG capsule; Take 1 capsule (100 mg total) by mouth 3 (three) times daily as needed for cough.  Dispense: 20 capsule; Refill: 0 - predniSONE (DELTASONE) 20 MG tablet; Take 1 tablet (20 mg total) by mouth daily with breakfast for 5 days.  Dispense: 5 tablet; Refill: 0   Labs/tests ordered:   None  Next appt:   02/18/2022

## 2022-01-16 NOTE — Patient Instructions (Signed)

## 2022-02-13 ENCOUNTER — Other Ambulatory Visit: Payer: Self-pay | Admitting: Adult Health

## 2022-02-13 DIAGNOSIS — J069 Acute upper respiratory infection, unspecified: Secondary | ICD-10-CM

## 2022-02-13 MED ORDER — BENZONATATE 100 MG PO CAPS: ORAL_CAPSULE | ORAL | 0 refills | Status: DC

## 2022-02-13 NOTE — Telephone Encounter (Signed)
Will send prescription to the pharmacy.

## 2022-02-18 ENCOUNTER — Other Ambulatory Visit: Payer: PPO

## 2022-02-18 DIAGNOSIS — E785 Hyperlipidemia, unspecified: Secondary | ICD-10-CM

## 2022-02-18 DIAGNOSIS — Z113 Encounter for screening for infections with a predominantly sexual mode of transmission: Secondary | ICD-10-CM | POA: Diagnosis not present

## 2022-02-18 DIAGNOSIS — Z0000 Encounter for general adult medical examination without abnormal findings: Secondary | ICD-10-CM | POA: Diagnosis not present

## 2022-02-18 DIAGNOSIS — Z1159 Encounter for screening for other viral diseases: Secondary | ICD-10-CM | POA: Diagnosis not present

## 2022-02-18 DIAGNOSIS — R7303 Prediabetes: Secondary | ICD-10-CM | POA: Diagnosis not present

## 2022-02-19 ENCOUNTER — Encounter: Payer: Self-pay | Admitting: Adult Health

## 2022-02-19 ENCOUNTER — Ambulatory Visit (INDEPENDENT_AMBULATORY_CARE_PROVIDER_SITE_OTHER): Payer: PPO | Admitting: Adult Health

## 2022-02-19 VITALS — BP 126/68 | HR 76 | Temp 96.8°F

## 2022-02-19 DIAGNOSIS — J0111 Acute recurrent frontal sinusitis: Secondary | ICD-10-CM | POA: Diagnosis not present

## 2022-02-19 LAB — COMPLETE METABOLIC PANEL WITH GFR
AG Ratio: 1.8 (calc) (ref 1.0–2.5)
ALT: 14 U/L (ref 6–29)
AST: 16 U/L (ref 10–35)
Albumin: 4.2 g/dL (ref 3.6–5.1)
Alkaline phosphatase (APISO): 72 U/L (ref 37–153)
BUN: 16 mg/dL (ref 7–25)
CO2: 25 mmol/L (ref 20–32)
Calcium: 9.5 mg/dL (ref 8.6–10.4)
Chloride: 106 mmol/L (ref 98–110)
Creat: 0.69 mg/dL (ref 0.50–1.05)
Globulin: 2.3 g/dL (calc) (ref 1.9–3.7)
Glucose, Bld: 96 mg/dL (ref 65–99)
Potassium: 4.2 mmol/L (ref 3.5–5.3)
Sodium: 143 mmol/L (ref 135–146)
Total Bilirubin: 0.3 mg/dL (ref 0.2–1.2)
Total Protein: 6.5 g/dL (ref 6.1–8.1)
eGFR: 96 mL/min/{1.73_m2} (ref >=60)

## 2022-02-19 LAB — CBC WITH DIFFERENTIAL/PLATELET
Absolute Monocytes: 448 cells/uL (ref 200–950)
Basophils Absolute: 70 cells/uL (ref 0–200)
Basophils Relative: 1 %
Eosinophils Absolute: 280 cells/uL (ref 15–500)
Eosinophils Relative: 4 %
HCT: 37.8 % (ref 35.0–45.0)
Hemoglobin: 13 g/dL (ref 11.7–15.5)
Lymphs Abs: 2261 cells/uL (ref 850–3900)
MCH: 30.4 pg (ref 27.0–33.0)
MCHC: 34.4 g/dL (ref 32.0–36.0)
MCV: 88.5 fL (ref 80.0–100.0)
MPV: 10 fL (ref 7.5–12.5)
Monocytes Relative: 6.4 %
Neutro Abs: 3941 cells/uL (ref 1500–7800)
Neutrophils Relative %: 56.3 %
Platelets: 262 10*3/uL (ref 140–400)
RBC: 4.27 10*6/uL (ref 3.80–5.10)
RDW: 13 % (ref 11.0–15.0)
Total Lymphocyte: 32.3 %
WBC: 7 10*3/uL (ref 3.8–10.8)

## 2022-02-19 LAB — LIPID PANEL
Cholesterol: 130 mg/dL (ref <200)
HDL: 44 mg/dL — ABNORMAL LOW (ref >=50)
LDL Cholesterol (Calc): 60 mg/dL (calc)
Non-HDL Cholesterol (Calc): 86 mg/dL (calc) (ref <130)
Total CHOL/HDL Ratio: 3 (calc) (ref <5.0)
Triglycerides: 187 mg/dL — ABNORMAL HIGH (ref <150)

## 2022-02-19 LAB — HEMOGLOBIN A1C
Hgb A1c MFr Bld: 5.6 % of total Hgb (ref <5.7)
Mean Plasma Glucose: 114 mg/dL
eAG (mmol/L): 6.3 mmol/L

## 2022-02-19 LAB — HIV ANTIBODY (ROUTINE TESTING W REFLEX): HIV 1&2 Ab, 4th Generation: NONREACTIVE

## 2022-02-19 LAB — HEPATITIS C ANTIBODY: Hepatitis C Ab: NONREACTIVE

## 2022-02-19 LAB — TSH: TSH: 2.46 mIU/L (ref 0.40–4.50)

## 2022-02-19 MED ORDER — PREDNISONE 20 MG PO TABS: 40.0000 mg | ORAL_TABLET | Freq: Every day | ORAL | 0 refills | Status: DC

## 2022-02-19 MED ORDER — AMOXICILLIN-POT CLAVULANATE 875-125 MG PO TABS: 1.0000 | ORAL_TABLET | Freq: Two times a day (BID) | ORAL | 0 refills | Status: DC

## 2022-02-19 MED ORDER — FLUTICASONE PROPIONATE 50 MCG/ACT NA SUSP: 2.0000 | Freq: Every evening | NASAL | 0 refills | Status: DC

## 2022-02-19 NOTE — Patient Instructions (Signed)
Begin augmentin 875 mg twice daily for 14 days Prednisone 40 mg daily for 5 days

## 2022-02-19 NOTE — Progress Notes (Signed)
Location:  [place] of Service:  [place]: full   No Known Allergies  Chief Complaint  Patient presents with   Acute Visit    Patient presents today for cough since 12/2021. She report taking tessalon and it has not helped out the cough. She reports the prednisone and doxycycline helped with symptoms.    HPI:  She was treated for an URI in Jan of this year with doxycyline steroids and tesselon. Her cough has returned with congestion present. She is coughing up Danarius Mcconathy sputum. She is fatigued and denies any fevers.   Past Medical History:  Diagnosis Date   Anxiety state, unspecified    pt unsure and denies this   Contusion of buttock    DVT (deep vein thrombosis) in pregnancy    1990- none since then   Hematoma 10/06/2010   buttock    Hyperlipidemia    Stroke ([place]) 09/14/2019   possible TIA   Symptomatic menopausal or female climacteric states    Thrombophlebitis leg 1990   Varicose vein of leg    Varicose veins     Past Surgical History:  Procedure Laterality Date   CHOLECYSTECTOMY  December 1990   Dr.Matt Martin   COLONOSCOPY  09/14/2008   Diverticulosis, Dr.Medoff    ENDOMETRIAL ABLATION  2006   [place] SURGERY Left 10/12/2013   [facility]    Social History   Socioeconomic History   Marital status: Widowed    Spouse name: Not on file   Number of children: Not on file   Years of education: Not on file   Highest education level: Not on file  Occupational History   Occupation: Full Time  Tobacco Use   Smoking status: Never   Smokeless tobacco: Never  Vaping Use   Vaping Use: Never used  Substance and Sexual Activity   Alcohol use: Yes    Alcohol/week: 1.0 - 2.0 standard drink of alcohol    Types: 1 - 2 Glasses of wine per week   Drug use: No   Sexual activity: Yes    Birth control/protection: Surgical    Comment: ablation  Other Topics Concern   Not on file  Social History  Narrative   Lives with BF   Right Handed   Drinks 1 cups caffeine daily   Social Determinants of Health   Financial Resource Strain: Not on file  Food Insecurity: Not on file  Transportation Needs: Not on file  Physical Activity: Not on file  Stress: Not on file  Social Connections: Not on file  Intimate Partner Violence: Not on file   Family History  Problem Relation Age of Onset   Macular degeneration Mother    Coronary artery disease Father    Breast cancer Neg Hx    Colon cancer Neg Hx    Esophageal cancer Neg Hx    Rectal cancer Neg Hx    Stomach cancer Neg Hx       VITAL SIGNS BP 126/68   Pulse 76   Temp (!) 96.8 F (36 C)   SpO2 97%   Outpatient Encounter Medications as of 02/19/2022  Medication Sig   aspirin EC 81 MG tablet Take 81 mg by mouth daily. Swallow whole.   benzonatate (TESSALON) 100 MG capsule TAKE 1 CAPSULE BY MOUTH 3 TIMES A DAY AS NEEDED FOR COUGH   Cholecalciferol (VITAMIN D3) 10 MCG (400 UNIT) CAPS  Take by mouth.   rosuvastatin (CRESTOR) 5 MG tablet TAKE ONE TABLET BY MOUTH EVERY NIGHT AT BEDTIME   Facility-Administered Encounter Medications as of 02/19/2022  Medication   sodium chloride flush (NS) 0.9 % injection 10 mL     SIGNIFICANT DIAGNOSTIC EXAMS   Review of Systems  Constitutional:  Positive for malaise/fatigue. Negative for fever.  HENT:  Positive for congestion.   Respiratory:  Positive for cough and sputum production. Negative for shortness of breath.   Cardiovascular:  Negative for chest pain, palpitations and leg swelling.  Gastrointestinal:  Negative for abdominal pain, constipation and heartburn.  Musculoskeletal:  Negative for back pain, joint pain and myalgias.  Skin: Negative.   Neurological:  Negative for dizziness.  Psychiatric/Behavioral:  The patient is not nervous/anxious.     Physical Exam Constitutional:      General: She is not in acute distress.    Appearance: She is well-developed. She is not diaphoretic.   HENT:     Nose: Nose normal.     Mouth/Throat:     Mouth: Mucous membranes are moist.     Pharynx: Oropharynx is clear.  Neck:     Thyroid: No thyromegaly.  Cardiovascular:     Rate and Rhythm: Normal rate and regular rhythm.     Heart sounds: Normal heart sounds.  Pulmonary:     Effort: Pulmonary effort is normal. No respiratory distress.     Breath sounds: Normal breath sounds.  Abdominal:     General: Bowel sounds are normal. There is no distension.     Palpations: Abdomen is soft.     Tenderness: There is no abdominal tenderness.  Musculoskeletal:        General: Normal range of motion.     Cervical back: Neck supple.     Right lower leg: No edema.     Left lower leg: No edema.  Lymphadenopathy:     Cervical: Cervical adenopathy present.  Skin:    General: Skin is warm and dry.  Neurological:     Mental Status: She is alert and oriented to person, place, and time.  Psychiatric:        Mood and Affect: Mood normal.       ASSESSMENT/ PLAN:  TODAY  Acute frontal sinusitis recurrent: will begin augmentin 875 mg twice daily for 14 days;  Prednisone 40 mg daily for 5 days; will begin flonase 2 puffs each nostril nightly  She is going to use florastor while on abt.   Ok Edwards NP [facility]  call [phone]

## 2022-02-21 ENCOUNTER — Encounter: Payer: Self-pay | Admitting: Family

## 2022-02-21 ENCOUNTER — Ambulatory Visit (INDEPENDENT_AMBULATORY_CARE_PROVIDER_SITE_OTHER): Payer: PPO | Admitting: Family

## 2022-02-21 VITALS — BP 121/88 | HR 65 | Temp 98.2°F | Resp 18 | Ht 67.0 in | Wt 164.2 lb

## 2022-02-21 DIAGNOSIS — E663 Overweight: Secondary | ICD-10-CM | POA: Diagnosis not present

## 2022-02-21 DIAGNOSIS — Z8673 Personal history of transient ischemic attack (TIA), and cerebral infarction without residual deficits: Secondary | ICD-10-CM

## 2022-02-21 DIAGNOSIS — E785 Hyperlipidemia, unspecified: Secondary | ICD-10-CM

## 2022-02-21 DIAGNOSIS — R002 Palpitations: Secondary | ICD-10-CM | POA: Diagnosis not present

## 2022-02-21 NOTE — Progress Notes (Signed)
Provider: Marlowe Sax FNP-C   Nalina Yeatman, Nelda Bucks, NP  Patient Care Team: Amar Sippel, Nelda Bucks, NP as PCP - General (Family Medicine) Mal Misty, MD (Inactive) as Consulting Physician (Vascular Surgery) Maisie Fus, MD (Inactive) as Consulting Physician (Obstetrics and Gynecology) Richmond Campbell, MD as Consulting Physician (Gastroenterology) Johnathan Hausen, MD as Consulting Physician (General Surgery) Clent Jacks, MD as Consulting Physician (Ophthalmology) Thurman Coyer, DO as Consulting Physician (Sports Medicine)  Extended Emergency Contact Information Primary Emergency Contact: Elliott,Ken Address: [place], [place] [address] Johnnette Litter of [place] Phone: [phone] Relation: Significant other Secondary Emergency Contact: Zielke,Julia Address: [address]          [place], [place] [address] Johnnette Litter of [place] Phone: [phone] Relation: Daughter Father: Montel Culver, [place] of [place] Phone: [phone]  Code Status:  Full Code  Goals of care: Advanced Directive information    10/01/2021    9:14 AM  Advanced Directives  Does Patient Have a Medical Advance Directive? No  Would patient like information on creating a medical advance directive? No - Patient declined     Chief Complaint  Patient presents with   Annual Exam    Physical & Fasting labs   Quality Metric Gaps    Needs to Discuss, , Shingrix Vaccine, Mammogram, COVID 19 Vaccine, TDAP vaccine, & Flu Shot.      HPI:  Pt is a 67 y.o. female seen today for medical management of chronic diseases.   She will make appointment for bone density and Pap smear with [facility]. States has her shingles vaccine,Tdap at the pharmacy will send records. Has had sinus infection and not feeling well x 2-3 times since last visit so has not been able to get her PNA and COVID-19 vaccine. Continues to walk with her friends  three times a week and with the trainer for muscle strengthening exercises twice a week. Recent lab work reviewed and discussed during visit.Labs were unremarkable except TRG were high previous 187 previous 170 states no changes in diet but will try to cut down on Carbo's.  Past Medical History:  Diagnosis Date   Anxiety state, unspecified    pt unsure and denies this   Contusion of buttock    DVT (deep vein thrombosis) in pregnancy    1990- none since then   Hematoma 10/06/2010   buttock    Hyperlipidemia    Stroke ([place]) 09/14/2019   possible TIA   Symptomatic menopausal or female climacteric states    Thrombophlebitis leg 1990   Varicose vein of leg    Varicose veins    Past Surgical History:  Procedure Laterality Date   CHOLECYSTECTOMY  December 1990   Dr.Matt Martin   COLONOSCOPY  09/14/2008   Diverticulosis, Dr.Medoff    ENDOMETRIAL ABLATION  2006   [place] SURGERY Left 10/12/2013   [facility]    No Known Allergies  Allergies as of 02/21/2022   No Known Allergies      Medication List        Accurate as of February 21, 2022 12:13 PM. If you have any questions, ask your nurse or doctor.          amoxicillin-clavulanate 875-125 MG tablet Commonly known as: AUGMENTIN Take 1 tablet by mouth 2 (two) times daily.   aspirin  EC 81 MG tablet Take 81 mg by mouth daily. Swallow whole.   benzonatate 100 MG capsule Commonly known as: TESSALON TAKE 1 CAPSULE BY MOUTH 3 TIMES A DAY AS NEEDED FOR COUGH   fluticasone 50 MCG/ACT nasal spray Commonly known as: FLONASE Place 2 sprays into both nostrils at bedtime.   predniSONE 20 MG tablet Commonly known as: DELTASONE Take 2 tablets (40 mg total) by mouth daily with breakfast.   rosuvastatin 5 MG tablet Commonly known as: CRESTOR TAKE ONE TABLET BY MOUTH EVERY NIGHT AT BEDTIME   Vitamin D3 10 MCG (400 UNIT) Caps Take by mouth.        Review of Systems  Constitutional:   Negative for appetite change, chills, fatigue, fever and unexpected weight change.  HENT:  Negative for congestion, dental problem, ear discharge, ear pain, facial swelling, hearing loss, nosebleeds, postnasal drip, rhinorrhea, sinus pressure, sinus pain, sneezing, sore throat, tinnitus and trouble swallowing.   Eyes:  Negative for pain, discharge, redness, itching and visual disturbance.  Respiratory:  Negative for cough, chest tightness, shortness of breath and wheezing.   Cardiovascular:  Negative for chest pain, palpitations and leg swelling.  Gastrointestinal:  Negative for abdominal distention, abdominal pain, blood in stool, constipation, diarrhea, nausea and vomiting.  Endocrine: Negative for cold intolerance, heat intolerance, polydipsia, polyphagia and polyuria.  Genitourinary:  Negative for difficulty urinating, dysuria, flank pain, frequency and urgency.  Musculoskeletal:  Negative for arthralgias, back pain, gait problem, joint swelling, myalgias, neck pain and neck stiffness.  Skin:  Negative for color change, pallor, rash and wound.  Neurological:  Negative for dizziness, syncope, speech difficulty, weakness, light-headedness, numbness and headaches.  Hematological:  Does not bruise/bleed easily.  Psychiatric/Behavioral:  Negative for agitation, behavioral problems, confusion, hallucinations, self-injury, sleep disturbance and suicidal ideas. The patient is not nervous/anxious.     Immunization History  Administered Date(s) Administered   Influenza Inj Mdck Quad Pf 10/28/2017   Influenza, Quadrivalent, Recombinant, Inj, Pf 10/28/2017   Influenza,inj,Quad PF,6+ Mos 11/30/2012, 01/03/2015, 10/24/2015, 12/16/2016, 10/22/2018, 09/29/2019   Influenza-Unspecified 11/19/2006, 10/11/2007, 11/25/2011, 10/17/2013, 10/12/2020   PFIZER(Purple Top)SARS-COV-2 Vaccination 01/30/2019, 02/17/2019, 11/04/2019, 10/03/2020   Tdap 11/25/2011   Zoster Recombinat (Shingrix) 03/22/2021, 09/06/2021    Pertinent  Health Maintenance Due  Topic Date Due   DEXA SCAN  06/12/2018   INFLUENZA VACCINE  08/13/2021   PAP SMEAR-Modifier  11/14/2021   MAMMOGRAM  12/12/2022   COLONOSCOPY (Pts 45-62yr Insurance coverage will need to be confirmed)  10/30/2031      09/29/2019    8:12 AM 02/20/2020    8:53 AM 02/21/2021    8:33 AM 10/01/2021    9:13 AM 02/21/2022   11:31 AM  Fall Risk  Falls in the past year? 0 0 0 0 0  Was there an injury with Fall? 0 0 0 0 0  Fall Risk Category Calculator 0 0 0 0 0  Fall Risk Category (Retired) Low Low Low Low   (RETIRED) Patient Fall Risk Level Low fall risk  Low fall risk Low fall risk   Patient at Risk for Falls Due to   No Fall Risks No Fall Risks   Fall risk Follow up   Falls evaluation completed;Education provided;Falls prevention discussed Falls evaluation completed    Functional Status Survey:    Vitals:   02/21/22 1133  BP: 121/88  Pulse: 65  Resp: 18  Temp: 98.2 F (36.8 C)  SpO2: 98%  Weight: 164 lb 4 oz (74.5 kg)  Height: 5'  7" (1.702 m)   Body mass index is 25.73 kg/m. Physical Exam Vitals reviewed.  Constitutional:      General: She is not in acute distress.    Appearance: Normal appearance. She is overweight. She is not ill-appearing or diaphoretic.  HENT:     Head: Normocephalic.     Right Ear: Tympanic membrane, ear canal and external ear normal. There is no impacted cerumen.     Left Ear: Tympanic membrane, ear canal and external ear normal. There is no impacted cerumen.     Nose: Nose normal. No congestion or rhinorrhea.     Mouth/Throat:     Mouth: Mucous membranes are moist.     Pharynx: Oropharynx is clear. No oropharyngeal exudate or posterior oropharyngeal erythema.  Eyes:     General: No scleral icterus.       Right eye: No discharge.        Left eye: No discharge.     Extraocular Movements: Extraocular movements intact.     Conjunctiva/sclera: Conjunctivae normal.     Pupils: Pupils are equal, round, and reactive  to light.  Neck:     Vascular: No carotid bruit.  Cardiovascular:     Rate and Rhythm: Normal rate and regular rhythm.     Pulses: Normal pulses.     Heart sounds: Normal heart sounds. No murmur heard.    No friction rub. No gallop.  Pulmonary:     Effort: Pulmonary effort is normal. No respiratory distress.     Breath sounds: Normal breath sounds. No wheezing, rhonchi or rales.  Chest:     Chest wall: No tenderness.  Abdominal:     General: Bowel sounds are normal. There is no distension.     Palpations: Abdomen is soft. There is no mass.     Tenderness: There is no abdominal tenderness. There is no right CVA tenderness, left CVA tenderness, guarding or rebound.  Musculoskeletal:        General: No swelling or tenderness. Normal range of motion.     Cervical back: Normal range of motion. No rigidity or tenderness.     Right lower leg: No edema.     Left lower leg: No edema.  Lymphadenopathy:     Cervical: No cervical adenopathy.  Skin:    General: Skin is warm and dry.     Coloration: Skin is not pale.     Findings: No bruising, erythema, lesion or rash.  Neurological:     Mental Status: She is alert and oriented to person, place, and time.     Cranial Nerves: No cranial nerve deficit.     Sensory: No sensory deficit.     Motor: No weakness.     Coordination: Coordination normal.     Gait: Gait normal.  Psychiatric:        Mood and Affect: Mood normal.        Speech: Speech normal.        Behavior: Behavior normal.        Thought Content: Thought content normal.        Judgment: Judgment normal.    Labs reviewed: Recent Labs    09/18/21 0821 02/18/22 0805  NA 142 143  K 4.3 4.2  CL 107 106  CO2 25 25  GLUCOSE 97 96  BUN 15 16  CREATININE 0.72 0.69  CALCIUM 9.3 9.5   Recent Labs    02/18/22 0805  AST 16  ALT 14  BILITOT 0.3  PROT 6.5  Recent Labs    09/18/21 0821 02/18/22 0805  WBC 6.6 7.0  NEUTROABS 3,775 3,941  HGB 13.0 13.0  HCT 38.0 37.8   MCV 87.6 88.5  PLT 235 262   Lab Results  Component Value Date   TSH 2.46 02/18/2022   Lab Results  Component Value Date   HGBA1C 5.6 02/18/2022   Lab Results  Component Value Date   CHOL 130 02/18/2022   HDL 44 (L) 02/18/2022   LDLCALC 60 02/18/2022   TRIG 187 (H) 02/18/2022   CHOLHDL 3.0 02/18/2022    Significant Diagnostic Results in last 30 days:  No results found.  Assessment/Plan 1. Dyslipidemia HDL low and TRG elevated  Continue on rosuvastatin Dietary modification and exercise at least 3 times per week for 30 minutes advised - Lipid panel; Future  2. History of TIA (transient ischemic attack) No residual noted Continue on rosuvastatin Continue with dietary modification and exercise as above - Lipid panel; Future - COMPLETE METABOLIC PANEL WITH GFR; Future - CBC with Differential/Platelet; Future  3. Palpitations Lab Results  Component Value Date   TSH 2.46 02/18/2022   - TSH; Future - CBC with Differential/Platelet; Future  4. Overweight with body mass index (BMI) 25.0-29.9 BMI 25.73 with associated comorbidities such as history of TIA and dyslipidemia Continue with lifestyle modification  Family/ staff Communication: Reviewed plan of care with patient verbalized understanding  Labs/tests ordered:  - Lipid panel; Future - COMPLETE METABOLIC PANEL WITH GFR; Future - CBC with Differential/Platelet; Future - TSH; Future  Next Appointment : Return in about 6 months (around 08/22/2022) for medical mangement of chronic issues., Fasting labs in 6 months prior to visit.   Sandrea Hughs, NP

## 2022-04-24 ENCOUNTER — Encounter: Payer: Self-pay | Admitting: Orthopedic Surgery

## 2022-04-24 ENCOUNTER — Ambulatory Visit (INDEPENDENT_AMBULATORY_CARE_PROVIDER_SITE_OTHER): Payer: PPO | Admitting: Orthopedic Surgery

## 2022-04-24 VITALS — BP 122/70 | HR 66 | Temp 97.3°F | Resp 16 | Ht 67.0 in | Wt 166.6 lb

## 2022-04-24 DIAGNOSIS — Z1231 Encounter for screening mammogram for malignant neoplasm of breast: Secondary | ICD-10-CM

## 2022-04-24 DIAGNOSIS — Z0000 Encounter for general adult medical examination without abnormal findings: Secondary | ICD-10-CM

## 2022-04-24 DIAGNOSIS — Z23 Encounter for immunization: Secondary | ICD-10-CM

## 2022-04-24 DIAGNOSIS — Z780 Asymptomatic menopausal state: Secondary | ICD-10-CM | POA: Diagnosis not present

## 2022-04-24 NOTE — Patient Instructions (Signed)
  Judith Oconnor , Thank you for taking time to come for your Medicare Wellness Visit. I appreciate your ongoing commitment to your health goals. Please review the following plan we discussed and let me know if I can assist you in the future.   These are the goals we discussed:  Goals      DIET - INCREASE WATER INTAKE     Weight (lb) < 200 lb (90.7 kg)     Weight (lb) < 200 lb (90.7 kg)        This is a list of the screening recommended for you and due dates:  Health Maintenance  Topic Date Due   DEXA scan (bone density measurement)  06/12/2018   COVID-19 Vaccine (5 - 2023-24 season) 09/13/2021   Pneumonia Vaccine (1 of 1 - PCV) Never done   Pap Smear  11/14/2021   Flu Shot  08/14/2022   Mammogram  12/12/2022   Medicare Annual Wellness Visit  04/24/2023   Colon Cancer Screening  10/30/2031   DTaP/Tdap/Td vaccine (3 - Td or Tdap) 11/23/2031   Hepatitis C Screening: USPSTF Recommendation to screen - Ages 77-79 yo.  Completed   HIV Screening  Completed   Zoster (Shingles) Vaccine  Completed   HPV Vaccine  Aged Out   Mammogram and bone density ordered > please schedule with [facility]

## 2022-04-24 NOTE — Progress Notes (Signed)
Subjective:   Judith Oconnor is a 67 y.o. female who presents for Medicare Annual (Subsequent) preventive examination.  Review of Systems     Cardiac Risk Factors include: advanced age (>69men, >25 women)     Objective:    Today's Vitals   04/24/22 1357  BP: 122/70  Pulse: 66  Resp: 16  Temp: (!) 97.3 F (36.3 C)  SpO2: 97%  Weight: 166 lb 9.6 oz (75.6 kg)  Height: 5\' 7"  (1.702 m)   Body mass index is 26.09 kg/m.     04/24/2022    2:06 PM 10/01/2021    9:14 AM 02/21/2021    8:33 AM 09/29/2019    8:13 AM 09/14/2019    5:40 PM 02/03/2019    9:13 AM 01/26/2017    9:13 AM  Advanced Directives  Does Patient Have a Medical Advance Directive? Yes No No Yes No Yes No  Type of Estate agent of [place];Living will     Healthcare Power of Attorney   Does patient want to make changes to medical advance directive? No - Patient declined   No - Guardian declined  No - Patient declined   Copy of Healthcare Power of Attorney in Chart? No - copy requested        Would patient like information on creating a medical advance directive?  No - Patient declined No - Patient declined  No - Patient declined  No - Patient declined    Current Medications (verified) Outpatient Encounter Medications as of 04/24/2022  Medication Sig   aspirin EC 81 MG tablet Take 81 mg by mouth daily. Swallow whole.   Cholecalciferol (VITAMIN D3) 10 MCG (400 UNIT) CAPS Take by mouth.   rosuvastatin (CRESTOR) 5 MG tablet TAKE ONE TABLET BY MOUTH EVERY NIGHT AT BEDTIME   [DISCONTINUED] amoxicillin-clavulanate (AUGMENTIN) 875-125 MG tablet Take 1 tablet by mouth 2 (two) times daily.   [DISCONTINUED] benzonatate (TESSALON) 100 MG capsule TAKE 1 CAPSULE BY MOUTH 3 TIMES A DAY AS NEEDED FOR COUGH   [DISCONTINUED] fluticasone (FLONASE) 50 MCG/ACT nasal spray Place 2 sprays into both nostrils at bedtime.   [DISCONTINUED] predniSONE (DELTASONE) 20 MG tablet Take 2 tablets (40 mg total) by mouth daily with  breakfast.   Facility-Administered Encounter Medications as of 04/24/2022  Medication   sodium chloride flush (NS) 0.9 % injection 10 mL    Allergies (verified) Patient has no known allergies.   History: Past Medical History:  Diagnosis Date   Anxiety state, unspecified    pt unsure and denies this   Contusion of buttock    DVT (deep vein thrombosis) in pregnancy    1990- none since then   Hematoma 10/06/2010   buttock    Hyperlipidemia    Stroke 09/14/2019   possible TIA   Symptomatic menopausal or female climacteric states    Thrombophlebitis leg 1990   Varicose vein of leg    Varicose veins    Past Surgical History:  Procedure Laterality Date   CHOLECYSTECTOMY  December 1990   Dr.Matt Martin   COLONOSCOPY  09/14/2008   Diverticulosis, Dr.Medoff    ENDOMETRIAL ABLATION  2006   Dr.Ron Jennette Kettle    VARICOSE VEIN SURGERY Left 10/12/2013   [facility] Mourning [facility]   Family History  Problem Relation Age of Onset   Macular degeneration Mother    Coronary artery disease Father    Breast cancer Neg Hx    Colon cancer Neg Hx    Esophageal cancer Neg  Hx    Rectal cancer Neg Hx    Stomach cancer Neg Hx    Social History   Socioeconomic History   Marital status: Widowed    Spouse name: Not on file   Number of children: Not on file   Years of education: Not on file   Highest education level: Not on file  Occupational History   Occupation: Full Time  Tobacco Use   Smoking status: Never   Smokeless tobacco: Never  Vaping Use   Vaping Use: Never used  Substance and Sexual Activity   Alcohol use: Yes    Alcohol/week: 1.0 - 2.0 standard drink of alcohol    Types: 1 - 2 Glasses of wine per week   Drug use: No   Sexual activity: Yes    Birth control/protection: Surgical    Comment: ablation  Other Topics Concern   Not on file  Social History Narrative   Lives with BF   Right Handed   Drinks 1 cups caffeine daily   Social Determinants of Health   Financial  Resource Strain: Low Risk  (04/24/2022)   Overall Financial Resource Strain (CARDIA)    Difficulty of Paying Living Expenses: Not hard at all  Food Insecurity: No Food Insecurity (04/24/2022)   Hunger Vital Sign    Worried About Running Out of Food in the Last Year: Never true    Ran Out of Food in the Last Year: Never true  Transportation Needs: No Transportation Needs (04/24/2022)   PRAPARE - Administrator, Civil Service (Medical): No    Lack of Transportation (Non-Medical): No  Physical Activity: Sufficiently Active (04/24/2022)   Exercise Vital Sign    Days of Exercise per Week: 6 days    Minutes of Exercise per Session: 50 min  Stress: No Stress Concern Present (04/24/2022)   [employer] of Occupational Health - Occupational Stress Questionnaire    Feeling of Stress : Only a little  Social Connections: Moderately Integrated (04/24/2022)   Social Connection and Isolation Panel [NHANES]    Frequency of Communication with Friends and Family: More than three times a week    Frequency of Social Gatherings with Friends and Family: More than three times a week    Attends Religious Services: Never    Database administrator or Organizations: Yes    Attends Engineer, structural: More than 4 times per year    Marital Status: Living with partner    Tobacco Counseling Counseling given: Not Answered   Clinical Intake:  Pre-visit preparation completed: No  Pain : No/denies pain     BMI - recorded: 26.09 Nutritional Status: BMI 25 -29 Overweight Nutritional Risks: None Diabetes: No  How often do you need to have someone help you when you read instructions, pamphlets, or other written materials from your doctor or pharmacy?: 1 - Never What is the last grade level you completed in school?: [place]  Diabetic?No  Interpreter Needed?: No      Activities of Daily Living    04/24/2022    2:18 PM  In your present state of health, do you have any  difficulty performing the following activities:  Hearing? 0  Vision? 0  Difficulty concentrating or making decisions? 0  Walking or climbing stairs? 0  Dressing or bathing? 0  Doing errands, shopping? 0  Preparing Food and eating ? N  Using the Toilet? N  In the past six months, have you accidently leaked urine? Y  Do you  have problems with loss of bowel control? N  Managing your Medications? N  Managing your Finances? N  Housekeeping or managing your Housekeeping? N    Patient Care Team: Ngetich, Donalee Citrininah C, NP as PCP - General (Family Medicine) Pryor OchoaLawson, James D, MD (Inactive) as Consulting Physician (Vascular Surgery) Freddy FinnerNeal, W Ronald, MD (Inactive) as Consulting Physician (Obstetrics and Gynecology) Sharrell KuMedoff, Jeffrey, MD as Consulting Physician (Gastroenterology) Luretha MurphyMartin, Matthew, MD as Consulting Physician (General Surgery) Ernesto RutherfordGroat, Robert, MD as Consulting Physician (Ophthalmology) Ralene Corkraper, Timothy R, DO as Consulting Physician (Sports Medicine)  Indicate any recent Medical Services you may have received from other than [facility] providers in the past year (date may be approximate).     Assessment:   This is a routine wellness examination for [facility].  Hearing/Vision screen Hearing Screening - Comments:: No hearing concerns.  Vision Screening - Comments:: No vision concerns. Patient wears prescription glasses. Patient last eye exam 3 years ago.  Dietary issues and exercise activities discussed: Current Exercise Habits: Structured exercise class, Type of exercise: walking;stretching;strength training/weights, Time (Minutes): 50, Frequency (Times/Week): 6, Weekly Exercise (Minutes/Week): 300, Intensity: Moderate, Exercise limited by: orthopedic condition(s)   Goals Addressed             This Visit's Progress    DIET - INCREASE WATER INTAKE   Not on track    Weight (lb) < 200 lb (90.7 kg)   166 lb 9.6 oz (75.6 kg)    Weight (lb) < 200 lb (90.7 kg)   166 lb 9.6 oz (75.6 kg)       Depression Screen    04/24/2022    2:01 PM 02/20/2020    8:53 AM 02/03/2019    9:14 AM 02/01/2018    9:16 AM 01/26/2017    9:10 AM 03/13/2016    9:47 AM 01/03/2015    1:04 PM  PHQ 2/9 Scores  PHQ - 2 Score 0 0 0 0 0 0 0    Fall Risk    04/24/2022    2:18 PM 04/24/2022    2:01 PM 02/21/2022   11:31 AM 10/01/2021    9:13 AM 02/21/2021    8:33 AM  Fall Risk   Falls in the past year? 0 0 0 0 0  Number falls in past yr: 0 0 0 0 0  Injury with Fall? 0 0 0 0 0  Risk for fall due to : No Fall Risks No Fall Risks  No Fall Risks No Fall Risks  Follow up Education provided Falls evaluation completed  Falls evaluation completed Falls evaluation completed;Education provided;Falls prevention discussed    FALL RISK PREVENTION PERTAINING TO THE HOME:  Any stairs in or around the home? Yes  If so, are there any without handrails? No  Home free of loose throw rugs in walkways, pet beds, electrical cords, etc? Yes  Adequate lighting in your home to reduce risk of falls? Yes   ASSISTIVE DEVICES UTILIZED TO PREVENT FALLS:  Life alert? No  Use of a cane, walker or w/c? No  Grab bars in the bathroom? No  Shower chair or bench in shower? No  Elevated toilet seat or a handicapped toilet? No   TIMED UP AND GO:  Was the test performed? No .  Length of time to ambulate 10 feet: N/A sec.   Gait steady and fast without use of assistive device  Cognitive Function:    04/24/2022    2:02 PM  MMSE - Mini Mental State Exam  Orientation to time 5  Orientation to Place 5  Registration 3  Attention/ Calculation 5  Recall 3  Language- name 2 objects 2  Language- repeat 1  Language- follow 3 step command 3  Language- read & follow direction 1  Write a sentence 1  Copy design 1  Total score 30        Immunizations Immunization History  Administered Date(s) Administered   Influenza Inj Mdck Quad Pf 10/28/2017   Influenza, High Dose Seasonal PF 11/19/2021   Influenza, Quadrivalent, Recombinant,  Inj, Pf 10/28/2017   Influenza,inj,Quad PF,6+ Mos 11/30/2012, 01/03/2015, 10/24/2015, 12/16/2016, 10/22/2018, 09/29/2019   Influenza-Unspecified 11/19/2006, 10/11/2007, 11/25/2011, 10/17/2013, 10/12/2020   PFIZER(Purple Top)SARS-COV-2 Vaccination 01/30/2019, 02/17/2019, 11/04/2019, 10/03/2020   Td 11/22/2021   Tdap 11/25/2011   Zoster Recombinat (Shingrix) 03/22/2021, 09/06/2021    TDAP status: Up to date  Flu Vaccine status: Up to date  Pneumococcal vaccine status: Due, Education has been provided regarding the importance of this vaccine. Advised may receive this vaccine at local pharmacy or Health Dept. Aware to provide a copy of the vaccination record if obtained from local pharmacy or Health Dept. Verbalized acceptance and understanding.  Covid-19 vaccine status: Completed vaccines  Qualifies for Shingles Vaccine? Yes   Zostavax completed No   Shingrix Completed?: Yes  Screening Tests Health Maintenance  Topic Date Due   DEXA SCAN  06/12/2018   COVID-19 Vaccine (5 - 2023-24 season) 09/13/2021   Pneumonia Vaccine 5+ Years old (1 of 1 - PCV) Never done   PAP SMEAR-Modifier  11/14/2021   INFLUENZA VACCINE  08/14/2022   MAMMOGRAM  12/12/2022   Medicare Annual Wellness (AWV)  04/24/2023   COLONOSCOPY (Pts 45-53yrs Insurance coverage will need to be confirmed)  10/30/2031   DTaP/Tdap/Td (3 - Td or Tdap) 11/23/2031   Hepatitis C Screening  Completed   HIV Screening  Completed   Zoster Vaccines- Shingrix  Completed   HPV VACCINES  Aged Out    Health Maintenance  Health Maintenance Due  Topic Date Due   DEXA SCAN  06/12/2018   COVID-19 Vaccine (5 - 2023-24 season) 09/13/2021   Pneumonia Vaccine 42+ Years old (1 of 1 - PCV) Never done   PAP SMEAR-Modifier  11/14/2021    Colorectal cancer screening: Type of screening: Colonoscopy. Completed 2023. Repeat every 10 years  Mammogram status: Completed 2022. Repeat every year  Bone Density status: Ordered never completed. Pt  provided with contact info and advised to call to schedule appt.  Lung Cancer Screening: (Low Dose CT Chest recommended if Age 43-80 years, 30 pack-year currently smoking OR have quit w/in 15years.) does not qualify.   Lung Cancer Screening Referral: none  Additional Screening:  Hepatitis C Screening: does not qualify; Completed   Vision Screening: Recommended annual ophthalmology exams for early detection of glaucoma and other disorders of the eye. Is the patient up to date with their annual eye exam?  No  Who is the provider or what is the name of the office in which the patient attends annual eye exams? Dr. Dione Booze If pt is not established with a provider, would they like to be referred to a provider to establish care? No .   Dental Screening: Recommended annual dental exams for proper oral hygiene  Community Resource Referral / Chronic Care Management: CRR required this visit?  No   CCM required this visit?  No      Plan:     I have personally reviewed and noted the following in the patient's chart:  Medical and social history Use of alcohol, tobacco or illicit drugs  Current medications and supplements including opioid prescriptions. Patient is not currently taking opioid prescriptions. Functional ability and status Nutritional status Physical activity Advanced directives List of other physicians Hospitalizations, surgeries, and ER visits in previous 12 months Vitals Screenings to include cognitive, depression, and falls Referrals and appointments  In addition, I have reviewed and discussed with patient certain preventive protocols, quality metrics, and best practice recommendations. A written personalized care plan for preventive services as well as general preventive health recommendations were provided to patient.     Octavia Heir, NP   04/24/2022   Nurse Notes: mammogram and DEXA scheduled

## 2022-06-17 ENCOUNTER — Ambulatory Visit
Admission: RE | Admit: 2022-06-17 | Discharge: 2022-06-17 | Disposition: A | Discharge status: Home / Self Care | Payer: PPO | Source: Ambulatory Visit | Attending: Orthopedic Surgery | Admitting: Orthopedic Surgery

## 2022-06-17 DIAGNOSIS — Z1231 Encounter for screening mammogram for malignant neoplasm of breast: Secondary | ICD-10-CM | POA: Diagnosis not present

## 2022-08-02 ENCOUNTER — Other Ambulatory Visit: Payer: Self-pay | Admitting: Family

## 2022-08-02 DIAGNOSIS — G459 Transient cerebral ischemic attack, unspecified: Secondary | ICD-10-CM

## 2022-08-02 DIAGNOSIS — E785 Hyperlipidemia, unspecified: Secondary | ICD-10-CM

## 2022-08-19 ENCOUNTER — Other Ambulatory Visit: Payer: PPO

## 2022-08-19 DIAGNOSIS — E785 Hyperlipidemia, unspecified: Secondary | ICD-10-CM

## 2022-08-19 DIAGNOSIS — R002 Palpitations: Secondary | ICD-10-CM | POA: Diagnosis not present

## 2022-08-19 DIAGNOSIS — Z8673 Personal history of transient ischemic attack (TIA), and cerebral infarction without residual deficits: Secondary | ICD-10-CM

## 2022-08-19 DIAGNOSIS — R7309 Other abnormal glucose: Secondary | ICD-10-CM | POA: Diagnosis not present

## 2022-08-22 ENCOUNTER — Ambulatory Visit: Payer: PPO | Admitting: Family

## 2022-08-22 VITALS — BP 122/72 | HR 69 | Temp 97.5°F | Resp 18 | Ht 67.0 in | Wt 163.0 lb

## 2022-08-22 DIAGNOSIS — E785 Hyperlipidemia, unspecified: Secondary | ICD-10-CM | POA: Diagnosis not present

## 2022-08-22 DIAGNOSIS — I83899 Varicose veins of unspecified lower extremities with other complications: Secondary | ICD-10-CM | POA: Diagnosis not present

## 2022-08-22 NOTE — Progress Notes (Signed)
Provider: Richarda Blade FNP-C   Moosa Bueche, Donalee Citrin, NP  Patient Care Team: Frans Valente, Donalee Citrin, NP as PCP - General (Family Medicine) Pryor Ochoa, MD (Inactive) as Consulting Physician (Vascular Surgery) Freddy Finner, MD (Inactive) as Consulting Physician (Obstetrics and Gynecology) Sharrell Ku, MD as Consulting Physician (Gastroenterology) Luretha Murphy, MD as Consulting Physician (General Surgery) Ernesto Rutherford, MD as Consulting Physician (Ophthalmology) Ralene Cork, DO as Consulting Physician (Sports Medicine)  Extended Emergency Contact Information Primary Emergency Contact: Elliott,Ken Address: [address]          [place], [place] [address] [place] Phone: [phone] Relation: Significant other Secondary Emergency Contact: Gali,Julia Address: [address]          [place], [place] [address] [place] of [place] Phone: [phone] Relation: Daughter Father: Joline Salt, [place] Home Phone: [phone]  Code Status:  Full Code  Goals of care: Advanced Directive information    04/24/2022    2:06 PM  Advanced Directives  Does Patient Have a Medical Advance Directive? Yes  Type of Estate agent of [place];Living will  Does patient want to make changes to medical advance directive? No - Patient declined  Copy of Healthcare Power of Attorney in Chart? No - copy requested     Chief Complaint  Patient presents with   Medical Management of Chronic Issues    6 months follow up    HPI:  Pt is a 67 y.o. female seen today for 6 months follow up for medical management of chronic diseases.  She request referral to vein and vascular specialist for worsening varicose veins.   She has appointment with physician for women on 09/06/2022 for pap smear     Past Medical History:  Diagnosis Date   Anxiety state, unspecified    pt unsure and denies this    Contusion of buttock    DVT (deep vein thrombosis) in pregnancy    1990- none since then   Hematoma 10/06/2010   buttock    Hyperlipidemia    Stroke (HCC) 09/14/2019   possible TIA   Symptomatic menopausal or female climacteric states    Thrombophlebitis leg 1990   Varicose vein of leg    Varicose veins    Past Surgical History:  Procedure Laterality Date   CHOLECYSTECTOMY  December 1990   Dr.Matt Martin   COLONOSCOPY  09/14/2008   Diverticulosis, Dr.Medoff    ENDOMETRIAL ABLATION  2006   Dr.Ron Neal    VARICOSE VEIN SURGERY Left 10/12/2013   [facility] Mourning [facility]    No Known Allergies  Allergies as of 08/22/2022   No Known Allergies      Medication List        Accurate as of August 22, 2022 11:59 PM. If you have any questions, ask your nurse or doctor.          aspirin EC 81 MG tablet Take 81 mg by mouth daily. Swallow whole.   rosuvastatin 5 MG tablet Commonly known as: CRESTOR TAKE ONE TABLET BY MOUTH EVERY NIGHT AT BEDTIME   saccharomyces boulardii 250 MG capsule Commonly known as: FLORASTOR Take 250 mg by mouth daily.   Vitamin D3 10 MCG (400 UNIT) Caps Take by mouth.        Review of Systems  Constitutional:  Negative for appetite change, chills, fatigue, fever and unexpected weight change.  HENT:  Negative  for congestion, dental problem, ear discharge, ear pain, facial swelling, hearing loss, nosebleeds, postnasal drip, rhinorrhea, sinus pressure, sinus pain, sneezing, sore throat, tinnitus and trouble swallowing.   Eyes:  Positive for visual disturbance. Negative for pain, discharge, redness and itching.       Wears eye glasses follows up with Dr.Groat   Respiratory:  Negative for cough, chest tightness, shortness of breath and wheezing.   Cardiovascular:  Negative for chest pain, palpitations and leg swelling.  Gastrointestinal:  Negative for abdominal distention, abdominal pain, blood in stool, constipation, diarrhea, nausea and vomiting.   Endocrine: Negative for cold intolerance, heat intolerance, polydipsia, polyphagia and polyuria.  Genitourinary:  Negative for difficulty urinating, dysuria, flank pain, frequency and urgency.  Musculoskeletal:  Negative for arthralgias, back pain, gait problem, joint swelling, myalgias, neck pain and neck stiffness.  Skin:  Negative for color change, pallor, rash and wound.  Neurological:  Negative for dizziness, syncope, speech difficulty, weakness, light-headedness, numbness and headaches.  Hematological:  Does not bruise/bleed easily.  Psychiatric/Behavioral:  Negative for agitation, behavioral problems, confusion, hallucinations, self-injury, sleep disturbance and suicidal ideas. The patient is not nervous/anxious.     Immunization History  Administered Date(s) Administered   Influenza Inj Mdck Quad Pf 10/28/2017   Influenza, High Dose Seasonal PF 11/19/2021   Influenza, Quadrivalent, Recombinant, Inj, Pf 10/28/2017   Influenza,inj,Quad PF,6+ Mos 11/30/2012, 01/03/2015, 10/24/2015, 12/16/2016, 10/22/2018, 09/29/2019   Influenza-Unspecified 11/19/2006, 10/11/2007, 11/25/2011, 10/17/2013, 10/12/2020   PFIZER(Purple Top)SARS-COV-2 Vaccination 01/30/2019, 02/17/2019, 11/04/2019, 10/03/2020   PNEUMOCOCCAL CONJUGATE-20 04/24/2022   Td 11/22/2021   Tdap 11/25/2011   Zoster Recombinant(Shingrix) 03/22/2021, 09/06/2021   Pertinent  Health Maintenance Due  Topic Date Due   DEXA SCAN  06/12/2018   PAP SMEAR-Modifier  11/14/2021   INFLUENZA VACCINE  08/14/2022   MAMMOGRAM  06/16/2024   Colonoscopy  10/30/2031      02/21/2021    8:33 AM 10/01/2021    9:13 AM 02/21/2022   11:31 AM 04/24/2022    2:01 PM 04/24/2022    2:18 PM  Fall Risk  Falls in the past year? 0 0 0 0 0  Was there an injury with Fall? 0 0 0 0 0  Fall Risk Category Calculator 0 0 0 0 0  Fall Risk Category (Retired) Low Low     (RETIRED) Patient Fall Risk Level Low fall risk Low fall risk     Patient at Risk for Falls Due  to No Fall Risks No Fall Risks  No Fall Risks No Fall Risks  Fall risk Follow up Falls evaluation completed;Education provided;Falls prevention discussed Falls evaluation completed  Falls evaluation completed Education provided   Functional Status Survey:    Vitals:   08/22/22 1530  BP: 122/72  Pulse: 69  Resp: 18  Temp: (!) 97.5 F (36.4 C)  SpO2: 96%  Weight: 163 lb (73.9 kg)  Height: 5\' 7"  (1.702 m)   Body mass index is 25.53 kg/m. Physical Exam Vitals reviewed.  Constitutional:      General: She is not in acute distress.    Appearance: Normal appearance. She is overweight. She is not ill-appearing or diaphoretic.  HENT:     Head: Normocephalic.     Right Ear: Tympanic membrane, ear canal and external ear normal. There is no impacted cerumen.     Left Ear: Tympanic membrane, ear canal and external ear normal. There is no impacted cerumen.     Nose: Nose normal. No congestion or rhinorrhea.     Mouth/Throat:  Mouth: Mucous membranes are moist.     Pharynx: Oropharynx is clear. No oropharyngeal exudate or posterior oropharyngeal erythema.  Eyes:     General: No scleral icterus.       Right eye: No discharge.        Left eye: No discharge.     Extraocular Movements: Extraocular movements intact.     Conjunctiva/sclera: Conjunctivae normal.     Pupils: Pupils are equal, round, and reactive to light.  Neck:     Vascular: No carotid bruit.  Cardiovascular:     Rate and Rhythm: Normal rate and regular rhythm.     Pulses: Normal pulses.     Heart sounds: Normal heart sounds. No murmur heard.    No friction rub. No gallop.     Comments: Varicose vein  Pulmonary:     Effort: Pulmonary effort is normal. No respiratory distress.     Breath sounds: Normal breath sounds. No wheezing, rhonchi or rales.  Chest:     Chest wall: No tenderness.  Abdominal:     General: Bowel sounds are normal. There is no distension.     Palpations: Abdomen is soft. There is no mass.      Tenderness: There is no abdominal tenderness. There is no right CVA tenderness, left CVA tenderness, guarding or rebound.  Musculoskeletal:        General: No swelling or tenderness. Normal range of motion.     Cervical back: Normal range of motion. No rigidity or tenderness.     Right lower leg: No edema.     Left lower leg: No edema.  Lymphadenopathy:     Cervical: No cervical adenopathy.  Skin:    General: Skin is warm and dry.     Coloration: Skin is not pale.     Findings: No bruising, erythema, lesion or rash.  Neurological:     Mental Status: She is alert and oriented to person, place, and time.     Cranial Nerves: No cranial nerve deficit.     Sensory: No sensory deficit.     Motor: No weakness.     Coordination: Coordination normal.     Gait: Gait normal.  Psychiatric:        Mood and Affect: Mood normal.        Speech: Speech normal.        Behavior: Behavior normal.        Thought Content: Thought content normal.        Judgment: Judgment normal.     Labs reviewed: Recent Labs    09/18/21 0821 02/18/22 0805 08/19/22 0804  NA 142 143 143  K 4.3 4.2 4.5  CL 107 106 106  CO2 25 25 28   GLUCOSE 97 96 110*  BUN 15 16 16   CREATININE 0.72 0.69 0.76  CALCIUM 9.3 9.5 9.8   Recent Labs    02/18/22 0805 08/19/22 0804  AST 16 14  ALT 14 16  BILITOT 0.3 0.4  PROT 6.5 6.6   Recent Labs    09/18/21 0821 02/18/22 0805 08/19/22 0804  WBC 6.6 7.0 6.6  NEUTROABS 3,775 3,941 3,709  HGB 13.0 13.0 13.0  HCT 38.0 37.8 38.8  MCV 87.6 88.5 88.6  PLT 235 262 251   Lab Results  Component Value Date   TSH 2.27 08/19/2022   Lab Results  Component Value Date   HGBA1C 5.4 08/19/2022   Lab Results  Component Value Date   CHOL 142 08/19/2022   HDL 54  08/19/2022   LDLCALC 62 08/19/2022   TRIG 183 (H) 08/19/2022   CHOLHDL 2.6 08/19/2022    Significant Diagnostic Results in last 30 days:  No results found.  Assessment/Plan 1. Dyslipidemia LDL at goal  -  continue on dietary modification and exercise   2. Varicose veins of leg with complications No edema noted  Worsening will refer to vascular for evaluation - Ambulatory referral to Vascular Surgery  Family/ staff Communication: Reviewed plan of care with patient verbalized understanding   Labs/tests ordered: None   Next Appointment : Return in about 6 months (around 02/22/2023) for medical mangement of chronic issues., Fasting labs in 6 months prior to visit.   Caesar Bookman, NP

## 2022-08-31 ENCOUNTER — Encounter: Payer: Self-pay | Admitting: Family

## 2022-09-03 DIAGNOSIS — N958 Other specified menopausal and perimenopausal disorders: Secondary | ICD-10-CM | POA: Diagnosis not present

## 2022-09-03 DIAGNOSIS — Z6825 Body mass index (BMI) 25.0-25.9, adult: Secondary | ICD-10-CM | POA: Diagnosis not present

## 2022-09-03 DIAGNOSIS — M8588 Other specified disorders of bone density and structure, other site: Secondary | ICD-10-CM | POA: Diagnosis not present

## 2022-09-03 DIAGNOSIS — Z01419 Encounter for gynecological examination (general) (routine) without abnormal findings: Secondary | ICD-10-CM | POA: Diagnosis not present

## 2022-09-30 DIAGNOSIS — H10413 Chronic giant papillary conjunctivitis, bilateral: Secondary | ICD-10-CM | POA: Diagnosis not present

## 2022-09-30 DIAGNOSIS — H25812 Combined forms of age-related cataract, left eye: Secondary | ICD-10-CM | POA: Diagnosis not present

## 2022-09-30 DIAGNOSIS — H04123 Dry eye syndrome of bilateral lacrimal glands: Secondary | ICD-10-CM | POA: Diagnosis not present

## 2022-09-30 DIAGNOSIS — H2511 Age-related nuclear cataract, right eye: Secondary | ICD-10-CM | POA: Diagnosis not present

## 2022-09-30 DIAGNOSIS — H43812 Vitreous degeneration, left eye: Secondary | ICD-10-CM | POA: Diagnosis not present

## 2022-10-07 DIAGNOSIS — L814 Other melanin hyperpigmentation: Secondary | ICD-10-CM | POA: Diagnosis not present

## 2022-10-07 DIAGNOSIS — L821 Other seborrheic keratosis: Secondary | ICD-10-CM | POA: Diagnosis not present

## 2022-10-07 DIAGNOSIS — D225 Melanocytic nevi of trunk: Secondary | ICD-10-CM | POA: Diagnosis not present

## 2022-10-07 DIAGNOSIS — B078 Other viral warts: Secondary | ICD-10-CM | POA: Diagnosis not present

## 2022-10-07 DIAGNOSIS — D229 Melanocytic nevi, unspecified: Secondary | ICD-10-CM | POA: Diagnosis not present

## 2022-10-07 DIAGNOSIS — L578 Other skin changes due to chronic exposure to nonionizing radiation: Secondary | ICD-10-CM | POA: Diagnosis not present

## 2022-10-09 ENCOUNTER — Other Ambulatory Visit: Payer: Self-pay | Admitting: *Deleted

## 2022-10-09 DIAGNOSIS — I83899 Varicose veins of unspecified lower extremities with other complications: Secondary | ICD-10-CM

## 2022-10-16 ENCOUNTER — Ambulatory Visit: Payer: PPO | Admitting: Physician Assistant

## 2022-10-16 ENCOUNTER — Ambulatory Visit (HOSPITAL_COMMUNITY)
Admission: RE | Admit: 2022-10-16 | Discharge: 2022-10-16 | Disposition: A | Discharge status: Home / Self Care | Payer: PPO | Source: Ambulatory Visit | Attending: Vascular Surgery | Admitting: Vascular Surgery

## 2022-10-16 VITALS — BP 132/80 | HR 68 | Temp 97.7°F | Resp 16 | Ht 67.0 in | Wt 163.0 lb

## 2022-10-16 DIAGNOSIS — I83892 Varicose veins of left lower extremities with other complications: Secondary | ICD-10-CM | POA: Diagnosis not present

## 2022-10-16 DIAGNOSIS — I83899 Varicose veins of unspecified lower extremities with other complications: Secondary | ICD-10-CM | POA: Diagnosis not present

## 2022-10-16 DIAGNOSIS — I872 Venous insufficiency (chronic) (peripheral): Secondary | ICD-10-CM

## 2022-10-16 DIAGNOSIS — I781 Nevus, non-neoplastic: Secondary | ICD-10-CM | POA: Diagnosis not present

## 2022-10-16 NOTE — Progress Notes (Signed)
Office Note     CC:  follow up Requesting Provider:  Caesar Bookman, NP  HPI: Judith Oconnor is a 67 y.o. ([birth year]) female who presents for evaluation of spider veins of the left knee.  She underwent left greater saphenous vein ablation with stab phlebectomy by Dr. Hart Rochester many years ago.  She has a history of a left leg DVT during pregnancy and early 90s.  She denies any venous ulcerations.  She does not wear compression however does elevate her legs at the end of the day.  She has also had sclerotherapy here in the past and returns due to an area in her left knee that has returned.  She denies tobacco use.   Past Medical History:  Diagnosis Date   Anxiety state, unspecified    pt unsure and denies this   Contusion of buttock    DVT (deep vein thrombosis) in pregnancy    1990- none since then   Hematoma 10/06/2010   buttock    Hyperlipidemia    Stroke (HCC) 09/14/2019   possible TIA   Symptomatic menopausal or female climacteric states    Thrombophlebitis leg 1990   Varicose vein of leg    Varicose veins     Past Surgical History:  Procedure Laterality Date   CHOLECYSTECTOMY  December 1990   Dr.Matt Martin   COLONOSCOPY  09/14/2008   Diverticulosis, Dr.Medoff    ENDOMETRIAL ABLATION  2006   Dr.Ron Jennette Kettle    VARICOSE VEIN SURGERY Left 10/12/2013   [facility]    Social History   Socioeconomic History   Marital status: Widowed    Spouse name: Not on file   Number of children: Not on file   Years of education: Not on file   Highest education level: Bachelor's degree (e.g., BA, AB, BS)  Occupational History   Occupation: Full Time  Tobacco Use   Smoking status: Never   Smokeless tobacco: Never  Vaping Use   Vaping status: Never Used  Substance and Sexual Activity   Alcohol use: Yes    Alcohol/week: 1.0 - 2.0 standard drink of alcohol    Types: 1 - 2 Glasses of wine per week   Drug use: No   Sexual activity: Yes    Birth control/protection:  Surgical    Comment: ablation  Other Topics Concern   Not on file  Social History Narrative   Lives with BF   Right Handed   Drinks 1 cups caffeine daily   Social Determinants of Health   Financial Resource Strain: Low Risk  (08/18/2022)   Overall Financial Resource Strain (CARDIA)    Difficulty of Paying Living Expenses: Not hard at all  Food Insecurity: No Food Insecurity (08/18/2022)   Hunger Vital Sign    Worried About Running Out of Food in the Last Year: Never true    Ran Out of Food in the Last Year: Never true  Transportation Needs: No Transportation Needs (08/18/2022)   PRAPARE - Administrator, Civil Service (Medical): No    Lack of Transportation (Non-Medical): No  Physical Activity: Sufficiently Active (08/18/2022)   Exercise Vital Sign    Days of Exercise per Week: 5 days    Minutes of Exercise per Session: 150+ min  Stress: No Stress Concern Present (08/18/2022)   [employer] of Occupational Health - Occupational Stress Questionnaire    Feeling of Stress : Only a little  Social Connections: Socially Integrated (08/18/2022)   Social Connection and  Isolation Panel [NHANES]    Frequency of Communication with Friends and Family: More than three times a week    Frequency of Social Gatherings with Friends and Family: Once a week    Attends Religious Services: More than 4 times per year    Active Member of [employer] or Organizations: Yes    Attends Engineer, structural: More than 4 times per year    Marital Status: Living with partner  Intimate Partner Violence: Not At Risk (04/24/2022)   Humiliation, Afraid, Rape, and Kick questionnaire    Fear of Current or Ex-Partner: No    Emotionally Abused: No    Physically Abused: No    Sexually Abused: No    Family History  Problem Relation Age of Onset   Macular degeneration Mother    Coronary artery disease Father    Breast cancer Neg Hx    Colon cancer Neg Hx    Esophageal cancer Neg Hx    Rectal cancer  Neg Hx    Stomach cancer Neg Hx     Current Outpatient Medications  Medication Sig Dispense Refill   aspirin EC 81 MG tablet Take 81 mg by mouth daily. Swallow whole.     Cholecalciferol (VITAMIN D3) 10 MCG (400 UNIT) CAPS Take by mouth.     rosuvastatin (CRESTOR) 5 MG tablet TAKE ONE TABLET BY MOUTH EVERY NIGHT AT BEDTIME 90 tablet 3   saccharomyces boulardii (FLORASTOR) 250 MG capsule Take 250 mg by mouth daily.     No current facility-administered medications for this visit.   Facility-Administered Medications Ordered in Other Visits  Medication Dose Route Frequency Provider Last Rate Last Admin   sodium chloride flush (NS) 0.9 % injection 10 mL  10 mL Intravenous PRN Reed, Tiffany L, DO   10 mL at 10/03/19 1335    No Known Allergies   REVIEW OF SYSTEMS:   [X]  denotes positive finding, [ ]  denotes negative finding Cardiac  Comments:  Chest pain or chest pressure:    Shortness of breath upon exertion:    Short of breath when lying flat:    Irregular heart rhythm:        Vascular    Pain in calf, thigh, or hip brought on by ambulation:    Pain in feet at night that wakes you up from your sleep:     Blood clot in your veins:    Leg swelling:         Pulmonary    Oxygen at home:    Productive cough:     Wheezing:         Neurologic    Sudden weakness in arms or legs:     Sudden numbness in arms or legs:     Sudden onset of difficulty speaking or slurred speech:    Temporary loss of vision in one eye:     Problems with dizziness:         Gastrointestinal    Blood in stool:     Vomited blood:         Genitourinary    Burning when urinating:     Blood in urine:        Psychiatric    Major depression:         Hematologic    Bleeding problems:    Problems with blood clotting too easily:        Skin    Rashes or ulcers:        Constitutional  Fever or chills:      PHYSICAL EXAMINATION:  Vitals:   10/16/22 1252  BP: 132/80  Pulse: 68  Resp: 16   Temp: 97.7 F (36.5 C)  TempSrc: Temporal  SpO2: 96%  Weight: 163 lb (73.9 kg)  Height: 5\' 7"  (1.702 m)    General:  WDWN in NAD; vital signs documented above Gait: Not observed HENT: WNL, normocephalic Pulmonary: normal non-labored breathing , without Rales, rhonchi,  wheezing Cardiac: regular HR Abdomen: soft, NT, no masses Skin: without rashes Vascular Exam/Pulses: palpable DP pulses Extremities: without ischemic changes, without Gangrene , without cellulitis; without open wounds;  Musculoskeletal: no muscle wasting or atrophy  Neurologic: A&O X 3 Psychiatric:  The pt has Normal affect.   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 female here for evaluation of worsening superficial veins of the left knee and distal thigh  Julita Ozbun is a 67 year old female with history of left lower extremity DVT during pregnancy.  She also had left greater saphenous vein ablation and stab phlebectomy in the past.  She is also had sclerotherapy here in the office.  She returns to inquire about more sclerotherapy to prominent veins in her left knee.  Left lower extremity venous reflux studies negative for DVT.  Findings are consistent with prior greater saphenous vein ablation.  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  We will arrange sclerotherapy with Cherene Julian, RN.  Recommended wearing knee-high compression, periodic elevation of the legs during the day, and avoiding prolonged sitting and standing.  She can otherwise follow-up on an as-needed basis.   Emilie Rutter, PA-C [facility] [phone]  Clinic MD:   Karin Lieu

## 2022-12-17 DIAGNOSIS — Z6826 Body mass index (BMI) 26.0-26.9, adult: Secondary | ICD-10-CM | POA: Diagnosis not present

## 2023-02-25 ENCOUNTER — Other Ambulatory Visit: Payer: Self-pay

## 2023-02-25 ENCOUNTER — Other Ambulatory Visit: Payer: PPO

## 2023-02-25 DIAGNOSIS — E782 Mixed hyperlipidemia: Secondary | ICD-10-CM | POA: Diagnosis not present

## 2023-02-25 DIAGNOSIS — E785 Hyperlipidemia, unspecified: Secondary | ICD-10-CM

## 2023-02-25 NOTE — Addendum Note (Signed)
Addended by: Kathrene Alu on: 02/25/2023 08:34 AM   Modules accepted: Orders

## 2023-02-26 LAB — COMPLETE METABOLIC PANEL WITH GFR
AG Ratio: 2.2 (calc) (ref 1.0–2.5)
ALT: 20 U/L (ref 6–29)
AST: 16 U/L (ref 10–35)
Albumin: 4.8 g/dL (ref 3.6–5.1)
Alkaline phosphatase (APISO): 74 U/L (ref 37–153)
BUN: 15 mg/dL (ref 7–25)
CO2: 29 mmol/L (ref 20–32)
Calcium: 10 mg/dL (ref 8.6–10.4)
Chloride: 104 mmol/L (ref 98–110)
Creat: 0.77 mg/dL (ref 0.50–1.05)
Globulin: 2.2 g/dL (ref 1.9–3.7)
Glucose, Bld: 104 mg/dL — ABNORMAL HIGH (ref 65–99)
Potassium: 4.5 mmol/L (ref 3.5–5.3)
Sodium: 141 mmol/L (ref 135–146)
Total Bilirubin: 0.5 mg/dL (ref 0.2–1.2)
Total Protein: 7 g/dL (ref 6.1–8.1)
eGFR: 85 mL/min/{1.73_m2} (ref >=60)

## 2023-02-26 LAB — CBC WITH DIFFERENTIAL/PLATELET
Absolute Lymphocytes: 1978 {cells}/uL (ref 850–3900)
Absolute Monocytes: 415 {cells}/uL (ref 200–950)
Basophils Absolute: 62 {cells}/uL (ref 0–200)
Basophils Relative: 1 %
Eosinophils Absolute: 248 {cells}/uL (ref 15–500)
Eosinophils Relative: 4 %
HCT: 41.3 % (ref 35.0–45.0)
Hemoglobin: 13.4 g/dL (ref 11.7–15.5)
MCH: 29.1 pg (ref 27.0–33.0)
MCHC: 32.4 g/dL (ref 32.0–36.0)
MCV: 89.6 fL (ref 80.0–100.0)
MPV: 10 fL (ref 7.5–12.5)
Monocytes Relative: 6.7 %
Neutro Abs: 3497 {cells}/uL (ref 1500–7800)
Neutrophils Relative %: 56.4 %
Platelets: 268 10*3/uL (ref 140–400)
RBC: 4.61 10*6/uL (ref 3.80–5.10)
RDW: 12.9 % (ref 11.0–15.0)
Total Lymphocyte: 31.9 %
WBC: 6.2 10*3/uL (ref 3.8–10.8)

## 2023-02-26 LAB — LIPID PANEL
Cholesterol: 154 mg/dL (ref <200)
HDL: 56 mg/dL (ref >=50)
LDL Cholesterol (Calc): 70 mg/dL
Non-HDL Cholesterol (Calc): 98 mg/dL (ref <130)
Total CHOL/HDL Ratio: 2.8 (calc) (ref <5.0)
Triglycerides: 201 mg/dL — ABNORMAL HIGH (ref <150)

## 2023-02-27 ENCOUNTER — Ambulatory Visit (INDEPENDENT_AMBULATORY_CARE_PROVIDER_SITE_OTHER): Payer: PPO | Admitting: Family

## 2023-02-27 ENCOUNTER — Encounter: Payer: Self-pay | Admitting: Family

## 2023-02-27 VITALS — BP 138/70 | HR 65 | Temp 98.2°F | Resp 19 | Ht 67.0 in | Wt 167.2 lb

## 2023-02-27 DIAGNOSIS — E785 Hyperlipidemia, unspecified: Secondary | ICD-10-CM | POA: Diagnosis not present

## 2023-02-27 DIAGNOSIS — R7303 Prediabetes: Secondary | ICD-10-CM | POA: Diagnosis not present

## 2023-02-27 DIAGNOSIS — I83899 Varicose veins of unspecified lower extremities with other complications: Secondary | ICD-10-CM

## 2023-03-01 NOTE — Progress Notes (Signed)
 Provider: Richarda Blade FNP-C   Aleiyah Halpin, Donalee Citrin, NP  Patient Care Team: Zenon Leaf, Donalee Citrin, NP as PCP - General (Family Medicine) Pryor Ochoa, MD (Inactive) as Consulting Physician (Vascular Surgery) Freddy Finner, MD (Inactive) as Consulting Physician (Obstetrics and Gynecology) Sharrell Ku, MD as Consulting Physician (Gastroenterology) Luretha Murphy, MD as Consulting Physician (General Surgery) Ernesto Rutherford, MD as Consulting Physician (Ophthalmology) Ralene Cork, DO as Consulting Physician (Sports Medicine)  Extended Emergency Contact Information Primary Emergency Contact: Elliott,Ken Address: [address]          [place], [place] [address] [place] of [place] Phone: [phone] Relation: Significant other Secondary Emergency Contact: Eberwein,Julia Address: [address]          [place], [place] [address] [place] of [place] Phone: [phone] Relation: Daughter Father: Joline Salt, [place] Home Phone: [phone]  Code Status:  Full Code  Goals of care: Advanced Directive information    02/27/2023   10:31 AM  Advanced Directives  Does Patient Have a Medical Advance Directive? Yes  Type of Estate agent of [place];Living will  Does patient want to make changes to medical advance directive? No - Patient declined  Copy of Healthcare Power of Attorney in Chart? No - copy requested     Chief Complaint  Patient presents with   Medical Management of Chronic Issues    6 month follow up. Discuss the need for DEXA, flu, covid and AWV.    Discussed the use of AI scribe software for clinical note transcription with the patient, who gave verbal consent to proceed.  History of Present Illness   Judith Oconnor is a 68 year old female with hyperlipidemia and Prediabetes who presents for a six-month follow-up.  She has been monitoring her triglyceride levels, which have  increased from 183 mg/dL to 324 mg/dL since her last visit. She has consulted with a nutritionist and made dietary changes, including reducing carbohydrates and increasing protein intake. Despite these changes, she is unsure how to further improve her triglyceride levels.  Her glucose levels have decreased from 110 mg/dL to 401 mg/dL. She exercises regularly, engaging in strength training twice a week and walking three times a week, with additional walks on weekends. She has also reduced her alcohol consumption.  She occasionally experiences palpitations but has no chest pain or shortness of breath. She has varicose veins and has consulted a specialist, who performed an ultrasound that showed no issues. She wears compression stockings as needed and reports no pain, burning, or cramping in her legs.  Her current medications include Crestor 5 mg for cholesterol, vitamin D3, and a baby aspirin. She has reduced her use of probiotics since incorporating yogurt into her diet for protein and probiotics.  She has recently had a bone density test and maintains regular vision check-ups. She has received her COVID, flu, and RSV vaccinations in the fall. No issues with sleep or urinary function and has increased her water intake.      Past Medical History:  Diagnosis Date   Anxiety state, unspecified    pt unsure and denies this   Contusion of buttock    DVT (deep vein thrombosis) in pregnancy    1990- none since then   Hematoma 10/06/2010   buttock    Hyperlipidemia    Stroke (HCC) 09/14/2019   possible TIA   Symptomatic menopausal or female climacteric states  Thrombophlebitis leg 1990   Varicose vein of leg    Varicose veins    Past Surgical History:  Procedure Laterality Date   CHOLECYSTECTOMY  December 1990   Dr[facility]Matt [facility]   COLONOSCOPY  09/14/2008   Diverticulosis, Dr.Medoff    ENDOMETRIAL ABLATION  2006   Dr.Ron Neal    VARICOSE VEIN SURGERY Left 10/12/2013   [facility]    No Known Allergies  Allergies as of 02/27/2023   No Known Allergies      Medication List        Accurate as of February 27, 2023 11:59 PM. If you have any questions, ask your nurse or doctor.          STOP taking these medications    saccharomyces boulardii 250 MG capsule Commonly known as: FLORASTOR Stopped by: Dayonna Selbe C Antwan Bribiesca       TAKE these medications    aspirin EC 81 MG tablet Take 81 mg by mouth daily. Swallow whole.   rosuvastatin 5 MG tablet Commonly known as: CRESTOR TAKE ONE TABLET BY MOUTH EVERY NIGHT AT BEDTIME   Vitamin D3 10 MCG (400 UNIT) Caps Take by mouth.        Review of Systems  Constitutional:  Negative for appetite change, chills, fatigue, fever and unexpected weight change.  HENT:  Negative for congestion, dental problem, ear discharge, ear pain, facial swelling, hearing loss, nosebleeds, postnasal drip, rhinorrhea, sinus pressure, sinus pain, sneezing, sore throat, tinnitus and trouble swallowing.   Eyes:  Negative for pain, discharge, redness, itching and visual disturbance.  Respiratory:  Negative for cough, chest tightness, shortness of breath and wheezing.   Cardiovascular:  Negative for chest pain, palpitations and leg swelling.       Varicose veins   Gastrointestinal:  Negative for abdominal distention, abdominal pain, blood in stool, constipation, diarrhea, nausea and vomiting.  Endocrine: Negative for cold intolerance, heat intolerance, polydipsia, polyphagia and polyuria.  Genitourinary:  Negative for difficulty urinating, dysuria, flank pain, frequency and urgency.  Musculoskeletal:  Negative for arthralgias, back pain, gait problem, joint swelling, myalgias, neck pain and neck stiffness.  Skin:  Negative for color change, pallor, rash and wound.  Neurological:  Negative for dizziness, syncope, speech difficulty, weakness, light-headedness, numbness and headaches.  Hematological:  Does not bruise/bleed easily.   Psychiatric/Behavioral:  Negative for agitation, behavioral problems, confusion, hallucinations, self-injury, sleep disturbance and suicidal ideas. The patient is not nervous/anxious.     Immunization History  Administered Date(s) Administered   Influenza Inj Mdck Quad Pf 10/28/2017   Influenza, High Dose Seasonal PF 11/19/2021   Influenza, Quadrivalent, Recombinant, Inj, Pf 10/28/2017   Influenza,inj,Quad PF,6+ Mos 11/30/2012, 01/03/2015, 10/24/2015, 12/16/2016, 10/22/2018, 09/29/2019   Influenza-Unspecified 11/19/2006, 10/11/2007, 11/25/2011, 10/17/2013, 10/12/2020   PFIZER(Purple Top)SARS-COV-2 Vaccination 01/30/2019, 02/17/2019, 11/04/2019, 10/03/2020   PNEUMOCOCCAL CONJUGATE-20 04/24/2022   Pfizer Covid-19 Vaccine Bivalent Booster 26yrs & up 09/22/2022   Td 11/22/2021   Tdap 11/25/2011   Zoster Recombinant(Shingrix) 03/22/2021, 09/06/2021   Pertinent  Health Maintenance Due  Topic Date Due   DEXA SCAN  06/12/2018   INFLUENZA VACCINE  08/14/2022   MAMMOGRAM  06/16/2024   Colonoscopy  10/30/2031      10/01/2021    9:13 AM 02/21/2022   11:31 AM 04/24/2022    2:01 PM 04/24/2022    2:18 PM 02/27/2023   10:30 AM  Fall Risk  Falls in the past year? 0 0 0 0 0  Was there an injury with Fall? 0 0  0 0 0  Fall Risk Category Calculator 0 0 0 0 0  Fall Risk Category (Retired) Low      (RETIRED) Patient Fall Risk Level Low fall risk      Patient at Risk for Falls Due to No Fall Risks  No Fall Risks No Fall Risks No Fall Risks  Fall risk Follow up Falls evaluation completed  Falls evaluation completed Education provided Falls evaluation completed   Functional Status Survey:    Vitals:   02/27/23 0829  BP: 138/70  Pulse: 65  Resp: 19  Temp: 98.2 F (36.8 C)  SpO2: 97%  Weight: 167 lb 3.2 oz (75.8 kg)  Height: 5\' 7"  (1.702 m)   Body mass index is 26.19 kg/m. Physical Exam Vitals reviewed.  Constitutional:      General: She is not in acute distress.    Appearance: Normal  appearance. She is overweight. She is not ill-appearing or diaphoretic.  HENT:     Head: Normocephalic.     Right Ear: Tympanic membrane, ear canal and external ear normal. There is no impacted cerumen.     Left Ear: Tympanic membrane, ear canal and external ear normal. There is no impacted cerumen.     Nose: Nose normal. No congestion or rhinorrhea.     Mouth/Throat:     Mouth: Mucous membranes are moist.     Pharynx: Oropharynx is clear. No oropharyngeal exudate or posterior oropharyngeal erythema.  Eyes:     General: No scleral icterus.       Right eye: No discharge.        Left eye: No discharge.     Extraocular Movements: Extraocular movements intact.     Conjunctiva/sclera: Conjunctivae normal.     Pupils: Pupils are equal, round, and reactive to light.  Neck:     Vascular: No carotid bruit.  Cardiovascular:     Rate and Rhythm: Normal rate and regular rhythm.     Pulses: Normal pulses.     Heart sounds: Normal heart sounds. No murmur heard.    No friction rub. No gallop.     Comments: Bilateral lower extremities varicose veins   Pulmonary:     Effort: Pulmonary effort is normal. No respiratory distress.     Breath sounds: Normal breath sounds. No wheezing, rhonchi or rales.  Chest:     Chest wall: No tenderness.  Abdominal:     General: Bowel sounds are normal. There is no distension.     Palpations: Abdomen is soft. There is no mass.     Tenderness: There is no abdominal tenderness. There is no right CVA tenderness, left CVA tenderness, guarding or rebound.  Musculoskeletal:        General: No swelling or tenderness. Normal range of motion.     Cervical back: Normal range of motion. No rigidity or tenderness.     Right lower leg: No edema.     Left lower leg: No edema.  Lymphadenopathy:     Cervical: No cervical adenopathy.  Skin:    General: Skin is warm and dry.     Coloration: Skin is not pale.     Findings: No bruising, erythema, lesion or rash.  Neurological:      Mental Status: She is alert and oriented to person, place, and time.     Cranial Nerves: No cranial nerve deficit.     Sensory: No sensory deficit.     Motor: No weakness.     Coordination: Coordination normal.  Gait: Gait normal.  Psychiatric:        Mood and Affect: Mood normal.        Speech: Speech normal.        Behavior: Behavior normal.        Thought Content: Thought content normal.        Judgment: Judgment normal.     Labs reviewed: Recent Labs    08/19/22 0804 02/25/23 0832  NA 143 141  K 4.5 4.5  CL 106 104  CO2 28 29  GLUCOSE 110* 104*  BUN 16 15  CREATININE 0.76 0.77  CALCIUM 9.8 10.0   Recent Labs    08/19/22 0804 02/25/23 0832  AST 14 16  ALT 16 20  BILITOT 0.4 0.5  PROT 6.6 7.0   Recent Labs    08/19/22 0804 02/25/23 0832  WBC 6.6 6.2  NEUTROABS 3,709 3,497  HGB 13.0 13.4  HCT 38.8 41.3  MCV 88.6 89.6  PLT 251 268   Lab Results  Component Value Date   TSH 2.27 08/19/2022   Lab Results  Component Value Date   HGBA1C 5.4 08/19/2022   Lab Results  Component Value Date   CHOL 154 02/25/2023   HDL 56 02/25/2023   LDLCALC 70 02/25/2023   TRIG 201 (H) 02/25/2023   CHOLHDL 2.8 02/25/2023    Significant Diagnostic Results in last 30 days:  No results found.  Assessment/Plan  Hypertriglyceridemia Triglyceride levels increased from 183 mg/dL to 161 mg/dL despite dietary changes. Currently on Crestor 5 mg for cholesterol management. Discussed monitoring sugar content in protein sources and the impact of weight loss on triglyceride levels. - Continue Crestor 5 mg daily - Re-evaluate dietary intake with the nutritionist at the end of February - Monitor triglyceride levels at the next follow-up - Lipid panel; Future  Prediabetes Glucose level decreased from 110 mg/dL to 096 mg/dL. Engaging in regular exercise. Awaiting A1c results to further assess glycemic control. Discussed benefits of continued exercise and dietary  modifications. - Await A1c results - Continue current exercise regimen - Monitor glucose levels at the next follow-up - TSH; Future - COMPLETE METABOLIC PANEL WITH GFR; Future - CBC with Differential/Platelet; Future - Hemoglobin A1c; Future  Hypertension Blood pressure is 138/70 mmHg. Maintaining consistent weight and dietary changes, including reduced alcohol intake. Discussed benefits of weight management and dietary modifications on blood pressure control. - Continue monitoring blood pressure - Encourage continued dietary modifications and weight management  Varicose Veins Reports occasional palpitations but no chest pain or shortness of breath. No significant changes in varicose veins; recent ultrasound was normal. Occasionally wears compression stockings. Discussed sclerotherapy for cosmetic concerns and need for compression stockings to manage symptoms. - Continue wearing compression stockings as needed - Monitor for any changes or worsening symptom  Varicose veins of leg with complications - continue to follow up with vein and vascular specialist  - continue with compression stockings   General Health Maintenance Received COVID, flu, and RSV vaccinations in the fall. Bone density test completed at [facility]. Vision check done in November with no changes in prescription. - Request records for bone density test from [facility] - Update vaccination records in the system  Follow-up - Schedule six-month follow-up appointment - Ensure lab work is ordered prior to the next visit.     Family/ staff Communication: Reviewed plan of care with patient verbalized   Labs/tests ordered:  - TSH; Future - COMPLETE METABOLIC PANEL WITH GFR; Future - CBC  with Differential/Platelet; Future - Hemoglobin A1c; Future  Next Appointment : Return in about 6 months (around 08/27/2023) for medical mangement of chronic issues., Fasting labs in 6 months prior to  visit.   Spent 35 minutes of Face to face and non-face to face with patient  >50% time spent counseling; reviewing medical record; tests; labs; documentation and developing future plan of care.   Caesar Bookman, NP

## 2023-03-11 NOTE — Telephone Encounter (Signed)
 A1c was not ordered, please advise

## 2023-03-12 DIAGNOSIS — E669 Obesity, unspecified: Secondary | ICD-10-CM | POA: Diagnosis not present

## 2023-03-12 DIAGNOSIS — Z6826 Body mass index (BMI) 26.0-26.9, adult: Secondary | ICD-10-CM | POA: Diagnosis not present

## 2023-06-03 ENCOUNTER — Other Ambulatory Visit: Payer: Self-pay | Admitting: Obstetrics and Gynecology

## 2023-06-03 DIAGNOSIS — Z1231 Encounter for screening mammogram for malignant neoplasm of breast: Secondary | ICD-10-CM

## 2023-06-19 ENCOUNTER — Ambulatory Visit

## 2023-06-23 ENCOUNTER — Ambulatory Visit: Admitting: Sports Medicine

## 2023-06-23 VITALS — BP 137/75 | Ht 67.0 in | Wt 167.0 lb

## 2023-06-23 DIAGNOSIS — M25561 Pain in right knee: Secondary | ICD-10-CM | POA: Diagnosis not present

## 2023-06-23 NOTE — Progress Notes (Signed)
   Subjective:    Patient ID: Judith Oconnor, female    DOB: 1955-05-04, 68 y.o.   MRN: [id number]  HPI chief complaint: Right knee pain  Judith Oconnor presents today with some anterior right knee pain that began acutely while climbing into bed.  This was about a month ago.  Since then, she has noticed pain directly over the tibial tubercle with direct pressure such as kneeling on her right knee or any sort of exercise that involves her being on her knees.  She has noticed a small bump in the area.  Denies pain elsewhere through the knee.  Denies pain with other activities.    Review of Systems As above    Objective:   Physical Exam  Well-developed, well-nourished.  No acute distress  Right knee: Full range of motion.  No effusion.  There is a small palpable spur at the tibial tubercle and the insertion of the patellar tendon.  No prepatellar swelling.  Limited [facility] ultrasound shows a small spur off of the patella at the insertion of the patellar tendon.  Patellar tendon appears unremarkable.      Assessment & Plan:   Small tibial bone spur, right knee  David will continue to avoid direct pressure over the area until her pain subsides.  I also suggested that she try a knee pad with any sort of exercise that involves direct contact with the ground.  She may benefit from topical Voltaren gel as needed.  Follow-up as needed.  This note was dictated using Dragon naturally speaking software and may contain errors in syntax, spelling, or content which have not been identified prior to signing this note.

## 2023-06-25 ENCOUNTER — Ambulatory Visit
Admission: RE | Admit: 2023-06-25 | Discharge: 2023-06-25 | Disposition: A | Discharge status: Home / Self Care | Source: Ambulatory Visit | Attending: Obstetrics and Gynecology | Admitting: Obstetrics and Gynecology

## 2023-06-25 ENCOUNTER — Ambulatory Visit

## 2023-06-25 DIAGNOSIS — Z1231 Encounter for screening mammogram for malignant neoplasm of breast: Secondary | ICD-10-CM

## 2023-07-20 DIAGNOSIS — Z6825 Body mass index (BMI) 25.0-25.9, adult: Secondary | ICD-10-CM | POA: Diagnosis not present

## 2023-07-26 ENCOUNTER — Other Ambulatory Visit: Payer: Self-pay | Admitting: Family

## 2023-07-26 DIAGNOSIS — G459 Transient cerebral ischemic attack, unspecified: Secondary | ICD-10-CM

## 2023-07-26 DIAGNOSIS — E785 Hyperlipidemia, unspecified: Secondary | ICD-10-CM

## 2023-08-24 ENCOUNTER — Other Ambulatory Visit: Payer: PPO

## 2023-08-24 DIAGNOSIS — R7303 Prediabetes: Secondary | ICD-10-CM

## 2023-08-24 DIAGNOSIS — E785 Hyperlipidemia, unspecified: Secondary | ICD-10-CM

## 2023-08-24 LAB — LIPID PANEL
Cholesterol: 130 mg/dL (ref <200)
HDL: 55 mg/dL (ref >=50)
LDL Cholesterol (Calc): 51 mg/dL
Non-HDL Cholesterol (Calc): 75 mg/dL (ref <130)
Total CHOL/HDL Ratio: 2.4 (calc) (ref <5.0)
Triglycerides: 159 mg/dL — ABNORMAL HIGH (ref <150)

## 2023-08-24 LAB — CBC WITH DIFFERENTIAL/PLATELET
Absolute Lymphocytes: 2325 {cells}/uL (ref 850–3900)
Absolute Monocytes: 473 {cells}/uL (ref 200–950)
Basophils Absolute: 68 {cells}/uL (ref 0–200)
Basophils Relative: 0.9 %
Eosinophils Absolute: 150 {cells}/uL (ref 15–500)
Eosinophils Relative: 2 %
HCT: 41.9 % (ref 35.0–45.0)
Hemoglobin: 13.8 g/dL (ref 11.7–15.5)
MCH: 29.9 pg (ref 27.0–33.0)
MCHC: 32.9 g/dL (ref 32.0–36.0)
MCV: 90.9 fL (ref 80.0–100.0)
MPV: 9.7 fL (ref 7.5–12.5)
Monocytes Relative: 6.3 %
Neutro Abs: 4485 {cells}/uL (ref 1500–7800)
Neutrophils Relative %: 59.8 %
Platelets: 254 Thousand/uL (ref 140–400)
RBC: 4.61 Million/uL (ref 3.80–5.10)
RDW: 13.4 % (ref 11.0–15.0)
Total Lymphocyte: 31 %
WBC: 7.5 Thousand/uL (ref 3.8–10.8)

## 2023-08-24 LAB — COMPREHENSIVE METABOLIC PANEL WITH GFR
AG Ratio: 2.4 (calc) (ref 1.0–2.5)
ALT: 23 U/L (ref 6–29)
AST: 15 U/L (ref 10–35)
Albumin: 4.8 g/dL (ref 3.6–5.1)
Alkaline phosphatase (APISO): 80 U/L (ref 37–153)
BUN: 18 mg/dL (ref 7–25)
CO2: 29 mmol/L (ref 20–32)
Calcium: 9.6 mg/dL (ref 8.6–10.4)
Chloride: 104 mmol/L (ref 98–110)
Creat: 0.71 mg/dL (ref 0.50–1.05)
Globulin: 2 g/dL (ref 1.9–3.7)
Glucose, Bld: 94 mg/dL (ref 65–99)
Potassium: 4.4 mmol/L (ref 3.5–5.3)
Sodium: 141 mmol/L (ref 135–146)
Total Bilirubin: 0.5 mg/dL (ref 0.2–1.2)
Total Protein: 6.8 g/dL (ref 6.1–8.1)
eGFR: 94 mL/min/1.73m2 (ref >=60)

## 2023-08-24 LAB — HEMOGLOBIN A1C
Hgb A1c MFr Bld: 5.4 % (ref <5.7)
Mean Plasma Glucose: 108 mg/dL
eAG (mmol/L): 6 mmol/L

## 2023-08-24 LAB — TSH: TSH: 2.1 m[IU]/L (ref 0.40–4.50)

## 2023-08-26 ENCOUNTER — Ambulatory Visit: Payer: Self-pay | Admitting: Family

## 2023-08-27 ENCOUNTER — Encounter: Payer: Self-pay | Admitting: Family

## 2023-08-27 ENCOUNTER — Ambulatory Visit (INDEPENDENT_AMBULATORY_CARE_PROVIDER_SITE_OTHER): Payer: PPO | Admitting: Family

## 2023-08-27 VITALS — BP 128/74 | HR 75 | Temp 97.8°F | Resp 19 | Ht 67.0 in | Wt 157.0 lb

## 2023-08-27 DIAGNOSIS — H269 Unspecified cataract: Secondary | ICD-10-CM | POA: Diagnosis not present

## 2023-08-27 DIAGNOSIS — I83899 Varicose veins of unspecified lower extremities with other complications: Secondary | ICD-10-CM | POA: Diagnosis not present

## 2023-08-27 DIAGNOSIS — E781 Pure hyperglyceridemia: Secondary | ICD-10-CM | POA: Diagnosis not present

## 2023-08-27 DIAGNOSIS — E663 Overweight: Secondary | ICD-10-CM | POA: Diagnosis not present

## 2023-08-27 DIAGNOSIS — B351 Tinea unguium: Secondary | ICD-10-CM | POA: Diagnosis not present

## 2023-08-27 DIAGNOSIS — M779 Enthesopathy, unspecified: Secondary | ICD-10-CM | POA: Diagnosis not present

## 2023-08-27 NOTE — Progress Notes (Signed)
 Provider: Roxan Plough FNP-C   Libby Goehring, Roxan BROCKS, NP  Patient Care Team: Lyne Khurana, Roxan BROCKS, NP as PCP - General (Family Medicine) Gerlean Lynwood BIRCH, MD (Inactive) as Consulting Physician (Vascular Surgery) Rosalynn LELON Ingle, MD (Inactive) as Consulting Physician (Obstetrics and Gynecology) Luis Purchase, MD as Consulting Physician (Gastroenterology) Gladis Cough, MD as Consulting Physician (General Surgery) Octavia Charleston, MD as Consulting Physician (Ophthalmology) Arvell Evalene SAUNDERS, DO as Consulting Physician (Sports Medicine)  Extended Emergency Contact Information Primary Emergency Contact: Elliott,Ken Address: [address]          [place], [place] [address] [place] Mobile Phone: [phone] Relation: Significant other Secondary Emergency Contact: Demario,Julia Address: [address]          [place], [place] [address] [place] Phone: [phone] Relation: Daughter Father: Craven,Ben          [place], [place] Home Phone: [phone]  Code Status:  Full Code  Goals of care: Advanced Directive information    08/27/2023   10:05 AM  Advanced Directives  Does Patient Have a Medical Advance Directive? No  Would patient like information on creating a medical advance directive? No - Patient declined     Chief Complaint  Patient presents with   Medical Management of Chronic Issues    6 month follow up    Discussed the use of AI scribe software for clinical note transcription with the patient, who gave verbal consent to proceed.  History of Present Illness   Judith Oconnor is a 68 year old female who presents for a six-month follow-up visit.  She has a persistent fungal infection on her left big toe. Topical treatments have been ineffective, and she is interested in exploring oral medication options.  She has been working with a gynecologist and a nutritionist and has been on a compounded GLP-1 medication,  which has helped her lose about ten pounds since June. Her blood glucose levels have improved, with her glucose decreasing from 104 to 94, and her A1c indicating she is not prediabetic. She experienced nausea and diarrhea when increasing the medication dose but has since adjusted the dose and no longer experiences these symptoms.  She has a history of spider veins but has not followed up recently. She does not wear compression stockings regularly as she has not been traveling. No pain is associated with the veins.  She had a bone spur on her leg, which was initially painful but is no longer causing discomfort. She has another bone spur on her heel.  No issues with anxiety, depression, or sleep disturbances. She continues to exercise regularly and maintains a healthy diet. No issues with acid reflux, urinary symptoms, or numbness and tingling.  She has a cataract that is being monitored but reports no changes in vision. She has been vaccinated for flu, COVID, pneumonia, and shingles.    Past Medical History:  Diagnosis Date   Anxiety state, unspecified    pt unsure and denies this   Contusion of buttock    DVT (deep vein thrombosis) in pregnancy    1990- none since then   Hematoma 10/06/2010   buttock    Hyperlipidemia    Stroke (HCC) 09/14/2019   possible TIA   Symptomatic menopausal or female climacteric states    Thrombophlebitis leg 1990   Varicose vein of leg    Varicose veins    Past Surgical History:  Procedure Laterality Date   CHOLECYSTECTOMY  December  1990   Dr.Matt Martin   COLONOSCOPY  09/14/2008   Diverticulosis, Dr.Medoff    ENDOMETRIAL ABLATION  2006   Dr.Ron Neal    VARICOSE VEIN SURGERY Left 10/12/2013   [facility]    No Known Allergies  Allergies as of 08/27/2023   No Known Allergies      Medication List        Accurate as of August 27, 2023 11:01 AM. If you have any questions, ask your nurse or doctor.          aspirin  EC 81 MG  tablet Take 81 mg by mouth daily. Swallow whole.   rosuvastatin  5 MG tablet Commonly known as: CRESTOR  TAKE 1 TABLET BY MOUTH EVERY NIGHT AT BEDTIME   UNABLE TO FIND Inject 2.5 mg into the skin once a week. Lipo slim injection   Vitamin D3 10 MCG (400 UNIT) Caps Take by mouth.        Review of Systems  Constitutional:  Negative for appetite change, chills, fatigue, fever and unexpected weight change.  HENT:  Negative for congestion, dental problem, ear discharge, ear pain, facial swelling, hearing loss, nosebleeds, postnasal drip, rhinorrhea, sinus pressure, sinus pain, sneezing, sore throat, tinnitus and trouble swallowing.   Eyes:  Negative for pain, discharge, redness, itching and visual disturbance.       Follows up with Opthalmology for cataract   Respiratory:  Negative for cough, chest tightness, shortness of breath and wheezing.   Cardiovascular:  Negative for chest pain, palpitations and leg swelling.  Gastrointestinal:  Negative for abdominal distention, abdominal pain, constipation, diarrhea, nausea and vomiting.  Endocrine: Negative for cold intolerance, heat intolerance, polydipsia, polyphagia and polyuria.  Genitourinary:  Negative for difficulty urinating, dysuria, flank pain, frequency and urgency.  Musculoskeletal:  Negative for arthralgias, back pain, gait problem, joint swelling, myalgias, neck pain and neck stiffness.  Skin:  Negative for color change, pallor, rash and wound.       Left great toe yellow skin discoloration   Neurological:  Negative for dizziness, syncope, speech difficulty, weakness, light-headedness, numbness and headaches.  Hematological:  Does not bruise/bleed easily.  Psychiatric/Behavioral:  Negative for agitation, behavioral problems, confusion, hallucinations, self-injury, sleep disturbance and suicidal ideas. The patient is not nervous/anxious.     Immunization History  Administered Date(s) Administered   Influenza Inj Mdck Quad Pf  10/28/2017   Influenza, High Dose Seasonal PF 11/19/2021   Influenza, Quadrivalent, Recombinant, Inj, Pf 10/28/2017   Influenza,inj,Quad PF,6+ Mos 11/30/2012, 01/03/2015, 10/24/2015, 12/16/2016, 10/22/2018, 09/29/2019   Influenza-Unspecified 11/19/2006, 10/11/2007, 11/25/2011, 10/17/2013, 10/12/2020   PFIZER(Purple Top)SARS-COV-2 Vaccination 01/30/2019, 02/17/2019, 11/04/2019, 10/03/2020   PNEUMOCOCCAL CONJUGATE-20 04/24/2022   Pfizer Covid-19 Vaccine Bivalent Booster 46yrs & up 09/22/2022   Respiratory Syncytial Virus Vaccine,Recomb Aduvanted(Arexvy) 11/06/2022   Td 11/22/2021   Tdap 11/25/2011, 11/22/2021   Zoster Recombinant(Shingrix) 03/22/2021, 09/06/2021   Pertinent  Health Maintenance Due  Topic Date Due   INFLUENZA VACCINE  10/27/2023 (Originally 08/14/2023)   DEXA SCAN  11/27/2023 (Originally 06/12/2018)   MAMMOGRAM  06/24/2025   Colonoscopy  10/30/2031      02/21/2022   11:31 AM 04/24/2022    2:01 PM 04/24/2022    2:18 PM 02/27/2023   10:30 AM 08/27/2023   10:04 AM  Fall Risk  Falls in the past year? 0 0 0 0 0  Was there an injury with Fall? 0 0 0 0 0  Fall Risk Category Calculator 0 0 0 0 0  Patient at Risk for Falls  Due to  No Fall Risks No Fall Risks No Fall Risks No Fall Risks  Fall risk Follow up  Falls evaluation completed Education provided Falls evaluation completed Falls evaluation completed   Functional Status Survey:    Vitals:   08/27/23 1014  BP: 128/74  Pulse: 75  Resp: 19  Temp: 97.8 F (36.6 C)  SpO2: 97%  Weight: 157 lb (71.2 kg)  Height: 5' 7 (1.702 m)   Body mass index is 24.59 kg/m. Physical Exam VITALS: T- 97.8, P- 75, BP- 128/74, SaO2- 97% MEASUREMENTS: Weight- 157 lbs. GENERAL: Alert, cooperative, well developed, no acute distress. HEENT: Normocephalic, normal oropharynx, moist mucous membranes, no sinus tenderness. NECK: No lymphadenopathy, thyroid  normal. CHEST: Clear to auscultation bilaterally, no wheezes, rhonchi, or  crackles. CARDIOVASCULAR: Normal heart rate and rhythm, S1 and S2 normal without murmurs.varicose vein bilateral lower extremities  ABDOMEN: Soft, non-tender, non-distended, without organomegaly, normal bowel sounds. EXTREMITIES: No cyanosis, edema, numbness, or tingling. MUSCULOSKELETAL: Hip range of motion normal, back non-tender. NEUROLOGICAL: Cranial nerves grossly intact, moves all extremities without gross motor or sensory deficit.  SKIN: No rash,no lesion or erythema   PSYCHIATRY/BEHAVIORAL: Mood stable    Labs reviewed: Recent Labs    02/25/23 0832 08/24/23 0825  NA 141 141  K 4.5 4.4  CL 104 104  CO2 29 29  GLUCOSE 104* 94  BUN 15 18  CREATININE 0.77 0.71  CALCIUM  10.0 9.6   Recent Labs    02/25/23 0832 08/24/23 0825  AST 16 15  ALT 20 23  BILITOT 0.5 0.5  PROT 7.0 6.8   Recent Labs    02/25/23 0832 08/24/23 0804  WBC 6.2 7.5  NEUTROABS 3,497 4,485  HGB 13.4 13.8  HCT 41.3 41.9  MCV 89.6 90.9  PLT 268 254   Lab Results  Component Value Date   TSH 2.10 08/24/2023   Lab Results  Component Value Date   HGBA1C 5.4 08/24/2023   Lab Results  Component Value Date   CHOL 130 08/24/2023   HDL 55 08/24/2023   LDLCALC 51 08/24/2023   TRIG 159 (H) 08/24/2023   CHOLHDL 2.4 08/24/2023    Significant Diagnostic Results in last 30 days:  No results found.  Assessment/Plan  Onychomycosis of left great toe Chronic onychomycosis of the left great toe with ineffective topical treatments. - Refer to podiatrist for debridement and treatment - Monitor liver function if oral antifungal treatment is initiated  Obesity Weight decreased from 167 lbs to 157 lbs since June due to dietary changes, exercise, and compounded GPL1 medication. Experienced diarrhea with increased dosage, resolved with adjustment. - Continue current dosage of compounded GPL1 medication - Continue dietary changes and exercise regimen - Follow up with nutritionist every 4-6  weeks  Hypertriglyceridemia Triglycerides decreased from 201 mg/dL to 840 mg/dL, approaching target of below 150 mg/dL, attributed to weight loss and dietary changes.  Bone spurs of lower extremities Presence of bone spurs in lower extremities, recent one on leg caused sharp pain, now resolved. No current pain reported.  Spider veins of lower extremities Presence of spider veins in lower extremities, considered cosmetic with no pain or significant symptoms - follow up with vein and vascular as advised.  Cataract under observation Cataract under observation with no immediate intervention required.   Family/ staff Communication: Reviewed plan of care with patient verbalized understanding   Labs/tests ordered:  - CBC with Differential/Platelet - CMP with eGFR([facility]) - TSH - Lipid panel   Next Appointment : Return in  about 6 months (around 02/27/2024) for medical mangement of chronic issues.,Fasting labs in 6 months prior to visit.AWV.   Spent 30 minutes of Face to face and non-face to face with patient  >50% time spent counseling; reviewing medical record; tests; labs; documentation and developing future plan of care.   Roxan JAYSON Plough, NP

## 2023-08-27 NOTE — Patient Instructions (Signed)
 1.Patient will go to local pharmacy to receive flu and covid vaccines. 2. Stop by check out to schedule Annual Wellness Visit.

## 2023-09-02 ENCOUNTER — Encounter: Admitting: Family

## 2023-09-03 ENCOUNTER — Encounter: Payer: Self-pay | Admitting: Family

## 2023-09-03 ENCOUNTER — Ambulatory Visit (INDEPENDENT_AMBULATORY_CARE_PROVIDER_SITE_OTHER): Admitting: Family

## 2023-09-03 VITALS — BP 118/72 | HR 63 | Temp 97.8°F | Resp 19 | Ht 67.0 in | Wt 158.8 lb

## 2023-09-03 DIAGNOSIS — Z0000 Encounter for general adult medical examination without abnormal findings: Secondary | ICD-10-CM | POA: Diagnosis not present

## 2023-09-03 NOTE — Patient Instructions (Signed)
 [name] , Thank you for taking time to come for your Medicare Wellness Visit. I appreciate your ongoing commitment to your health goals. Please review the following plan we discussed and let me know if I can assist you in the future.   Screening recommendations/referrals: Colonoscopy : Up to date  Mammogram  : Up to date  Bone Density  : Up to date  Recommended yearly ophthalmology/optometry visit for glaucoma screening and checkup Recommended yearly dental visit for hygiene and checkup  Vaccinations: Influenza vaccine- due annually in September/October Pneumococcal vaccine  : Up to date  Tdap vaccine  : Up to date  Shingles vaccine  : Up to date     Advanced directives:    Conditions/risks identified: advanced age (>67men, >76 women)  Next appointment: 1 year    Preventive Care 67 Years and Older, Female Preventive care refers to lifestyle choices and visits with your health care provider that can promote health and wellness. What does preventive care include? A yearly physical exam. This is also called an annual well check. Dental exams once or twice a year. Routine eye exams. Ask your health care provider how often you should have your eyes checked. Personal lifestyle choices, including: Daily care of your teeth and gums. Regular physical activity. Eating a healthy diet. Avoiding tobacco and drug use. Limiting alcohol use. Practicing safe sex. Taking low-dose aspirin  every day. Taking vitamin and mineral supplements as recommended by your health care provider. What happens during an annual well check? The services and screenings done by your health care provider during your annual well check will depend on your age, overall health, lifestyle risk factors, and family history of disease. Counseling  Your health care provider may ask you questions about your: Alcohol use. Tobacco use. Drug use. Emotional well-being. Home and relationship well-being. Sexual  activity. Eating habits. History of falls. Memory and ability to understand (cognition). Work and work Astronomer. Reproductive health. Screening  You may have the following tests or measurements: Height, weight, and BMI. Blood pressure. Lipid and cholesterol levels. These may be checked every 5 years, or more frequently if you are over 23 years old. Skin check. Lung cancer screening. You may have this screening every year starting at age 49 if you have a 30-pack-year history of smoking and currently smoke or have quit within the past 15 years. Fecal occult blood test (FOBT) of the stool. You may have this test every year starting at age 50. Flexible sigmoidoscopy or colonoscopy. You may have a sigmoidoscopy every 5 years or a colonoscopy every 10 years starting at age 75. Hepatitis C blood test. Hepatitis B blood test. Sexually transmitted disease (STD) testing. Diabetes screening. This is done by checking your blood sugar (glucose) after you have not eaten for a while (fasting). You may have this done every 1-3 years. Bone density scan. This is done to screen for osteoporosis. You may have this done starting at age 4. Mammogram. This may be done every 1-2 years. Talk to your health care provider about how often you should have regular mammograms. Talk with your health care provider about your test results, treatment options, and if necessary, the need for more tests. Vaccines  Your health care provider may recommend certain vaccines, such as: Influenza vaccine. This is recommended every year. Tetanus, diphtheria, and acellular pertussis (Tdap, Td) vaccine. You may need a Td booster every 10 years. Zoster vaccine. You may need this after age 21. Pneumococcal 13-valent conjugate (PCV13) vaccine. One dose  is recommended after age 36. Pneumococcal polysaccharide (PPSV23) vaccine. One dose is recommended after age 90. Talk to your health care provider about which screenings and vaccines  you need and how often you need them. This information is not intended to replace advice given to you by your health care provider. Make sure you discuss any questions you have with your health care provider. Document Released: 01/26/2015 Document Revised: 09/19/2015 Document Reviewed: 10/31/2014 Elsevier Interactive Patient Education  2017 [employer].  Fall Prevention in the Home Falls can cause injuries. They can happen to people of all ages. There are many things you can do to make your home safe and to help prevent falls. What can I do on the outside of my home? Regularly fix the edges of walkways and driveways and fix any cracks. Remove anything that might make you trip as you walk through a door, such as a raised step or threshold. Trim any bushes or trees on the path to your home. Use bright outdoor lighting. Clear any walking paths of anything that might make someone trip, such as rocks or tools. Regularly check to see if handrails are loose or broken. Make sure that both sides of any steps have handrails. Any raised decks and porches should have guardrails on the edges. Have any leaves, snow, or ice cleared regularly. Use sand or salt on walking paths during winter. Clean up any spills in your garage right away. This includes oil or grease spills. What can I do in the bathroom? Use night lights. Install grab bars by the toilet and in the tub and shower. Do not use towel bars as grab bars. Use non-skid mats or decals in the tub or shower. If you need to sit down in the shower, use a plastic, non-slip stool. Keep the floor dry. Clean up any water that spills on the floor as soon as it happens. Remove soap buildup in the tub or shower regularly. Attach bath mats securely with double-sided non-slip rug tape. Do not have throw rugs and other things on the floor that can make you trip. What can I do in the bedroom? Use night lights. Make sure that you have a light by your bed that  is easy to reach. Do not use any sheets or blankets that are too big for your bed. They should not hang down onto the floor. Have a firm chair that has side arms. You can use this for support while you get dressed. Do not have throw rugs and other things on the floor that can make you trip. What can I do in the kitchen? Clean up any spills right away. Avoid walking on wet floors. Keep items that you use a lot in easy-to-reach places. If you need to reach something above you, use a strong step stool that has a grab bar. Keep electrical cords out of the way. Do not use floor polish or wax that makes floors slippery. If you must use wax, use non-skid floor wax. Do not have throw rugs and other things on the floor that can make you trip. What can I do with my stairs? Do not leave any items on the stairs. Make sure that there are handrails on both sides of the stairs and use them. Fix handrails that are broken or loose. Make sure that handrails are as long as the stairways. Check any carpeting to make sure that it is firmly attached to the stairs. Fix any carpet that is loose or worn. Avoid  having throw rugs at the top or bottom of the stairs. If you do have throw rugs, attach them to the floor with carpet tape. Make sure that you have a light switch at the top of the stairs and the bottom of the stairs. If you do not have them, ask someone to add them for you. What else can I do to help prevent falls? Wear shoes that: Do not have high heels. Have rubber bottoms. Are comfortable and fit you well. Are closed at the toe. Do not wear sandals. If you use a stepladder: Make sure that it is fully opened. Do not climb a closed stepladder. Make sure that both sides of the stepladder are locked into place. Ask someone to hold it for you, if possible. Clearly mark and make sure that you can see: Any grab bars or handrails. First and last steps. Where the edge of each step is. Use tools that help you  move around (mobility aids) if they are needed. These include: Canes. Walkers. Scooters. Crutches. Turn on the lights when you go into a dark area. Replace any light bulbs as soon as they burn out. Set up your furniture so you have a clear path. Avoid moving your furniture around. If any of your floors are uneven, fix them. If there are any pets around you, be aware of where they are. Review your medicines with your doctor. Some medicines can make you feel dizzy. This can increase your chance of falling. Ask your doctor what other things that you can do to help prevent falls. This information is not intended to replace advice given to you by your health care provider. Make sure you discuss any questions you have with your health care provider. Document Released: 10/26/2008 Document Revised: 06/07/2015 Document Reviewed: 02/03/2014 Elsevier Interactive Patient Education  2017 [employer].

## 2023-09-03 NOTE — Progress Notes (Signed)
 Subjective:   Judith Oconnor is a 68 y.o. female who presents for Medicare Annual (Subsequent) preventive examination.  Visit Complete: In person  Patient Medicare AWV questionnaire was completed by the patient on 09/03/2023; I have confirmed that all information answered by patient is correct and no changes since this date.  Cardiac Risk Factors include: advanced age (>53men, >62 women)     Objective:    Today's Vitals   09/03/23 1427  BP: 118/72  Pulse: 63  Resp: 19  Temp: 97.8 F (36.6 C)  SpO2: 98%  Weight: 158 lb 12.8 oz (72 kg)  Height: 5' 7 (1.702 m)   Body mass index is 24.87 kg/m.     09/03/2023    2:22 PM 08/27/2023   10:05 AM 02/27/2023   10:31 AM 02/27/2023    8:31 AM 04/24/2022    2:06 PM 10/01/2021    9:14 AM 02/21/2021    8:33 AM  Advanced Directives  Does Patient Have a Medical Advance Directive? No No Yes No Yes No No  Type of Best boy of [place];Living will  Healthcare Power of [place];Living will    Does patient want to make changes to medical advance directive?   No - Patient declined  No - Patient declined    Copy of Healthcare Power of Attorney in Chart?   No - copy requested  No - copy requested    Would patient like information on creating a medical advance directive? No - Patient declined No - Patient declined  No - Patient declined  No - Patient declined No - Patient declined    Current Medications (verified) Outpatient Encounter Medications as of 09/03/2023  Medication Sig   aspirin  EC 81 MG tablet Take 81 mg by mouth daily. Swallow whole.   Cholecalciferol (VITAMIN D3) 10 MCG (400 UNIT) CAPS Take by mouth.   rosuvastatin  (CRESTOR ) 5 MG tablet TAKE 1 TABLET BY MOUTH EVERY NIGHT AT BEDTIME   UNABLE TO FIND Inject 2.5 mg into the skin once a week. Lipo slim injection   Facility-Administered Encounter Medications as of 09/03/2023  Medication   sodium chloride  flush (NS) 0.9 % injection 10 mL    Allergies  (verified) Patient has no known allergies.   History: Past Medical History:  Diagnosis Date   Anxiety state, unspecified    pt unsure and denies this   Contusion of buttock    DVT (deep vein thrombosis) in pregnancy    1990- none since then   Hematoma 10/06/2010   buttock    Hyperlipidemia    Stroke (HCC) 09/14/2019   possible TIA   Symptomatic menopausal or female climacteric states    Thrombophlebitis leg 1990   Varicose vein of leg    Varicose veins    Past Surgical History:  Procedure Laterality Date   CHOLECYSTECTOMY  December 1990   Dr.Matt Martin   COLONOSCOPY  09/14/2008   Diverticulosis, Dr.Medoff    ENDOMETRIAL ABLATION  2006   Dr.Ron Rosalynn    VARICOSE VEIN SURGERY Left 10/12/2013   [facility]   Family History  Problem Relation Age of Onset   Macular degeneration Mother    Coronary artery disease Father    Breast cancer Neg Hx    Colon cancer Neg Hx    Esophageal cancer Neg Hx    Rectal cancer Neg Hx    Stomach cancer Neg Hx    Social History   Socioeconomic History   Marital status:  Widowed    Spouse name: Not on file   Number of children: Not on file   Years of education: Not on file   Highest education level: Bachelor's degree (e.g., BA, AB, BS)  Occupational History   Occupation: Full Time  Tobacco Use   Smoking status: Never   Smokeless tobacco: Never  Vaping Use   Vaping status: Never Used  Substance and Sexual Activity   Alcohol use: Yes    Alcohol/week: 1.0 - 2.0 standard drink of alcohol    Types: 1 - 2 Glasses of wine per week   Drug use: No   Sexual activity: Yes    Birth control/protection: Surgical    Comment: ablation  Other Topics Concern   Not on file  Social History Narrative   Lives with BF   Right Handed   Drinks 1 cups caffeine daily   Social Drivers of Health   Financial Resource Strain: Low Risk  (08/23/2023)   Overall Financial Resource Strain (CARDIA)    Difficulty of Paying Living Expenses: Not  very hard  Food Insecurity: No Food Insecurity (09/03/2023)   Hunger Vital Sign    Worried About Running Out of Food in the Last Year: Never true    Ran Out of Food in the Last Year: Never true  Transportation Needs: No Transportation Needs (09/03/2023)   PRAPARE - Administrator, Civil Service (Medical): No    Lack of Transportation (Non-Medical): No  Physical Activity: Sufficiently Active (08/23/2023)   Exercise Vital Sign    Days of Exercise per Week: 5 days    Minutes of Exercise per Session: 120 min  Stress: No Stress Concern Present (08/23/2023)   [employer] of Occupational Health - Occupational Stress Questionnaire    Feeling of Stress: Only a little  Social Connections: Socially Integrated (08/23/2023)   Social Connection and Isolation Panel    Frequency of Communication with Friends and Family: More than three times a week    Frequency of Social Gatherings with Friends and Family: Three times a week    Attends Religious Services: 1 to 4 times per year    Active Member of Clubs or Organizations: Yes    Attends Engineer, structural: More than 4 times per year    Marital Status: Living with partner    Tobacco Counseling Counseling given: Not Answered   Clinical Intake:  Pre-visit preparation completed: No  Pain : No/denies pain     BMI - recorded: 24.87 Nutritional Status: BMI of 19-24  Normal Nutritional Risks: None Diabetes: No  How often do you need to have someone help you when you read instructions, pamphlets, or other written materials from your doctor or pharmacy?: 1 - Never What is the last grade level you completed in school?: 16 yrs  Interpreter Needed?: No      Activities of Daily Living    09/03/2023    2:39 PM 09/03/2023   11:42 AM  In your present state of health, do you have any difficulty performing the following activities:  Hearing? 0 0  Vision? 0 0  Difficulty concentrating or making decisions? 0 0  Walking or  climbing stairs? 0 0  Dressing or bathing? 0 0  Doing errands, shopping? 0 0  Preparing Food and eating ? N N  Using the Toilet? N   In the past six months, have you accidently leaked urine?  N  Do you have problems with loss of bowel control? N N  Managing  your Medications? N N  Managing your Finances? N N  Housekeeping or managing your Housekeeping? N N    Patient Care Team: Makaylia Hewett, Roxan BROCKS, NP as PCP - General (Family Medicine) Gerlean Lynwood BIRCH, MD (Inactive) as Consulting Physician (Vascular Surgery) Rosalynn LELON Ingle, MD (Inactive) as Consulting Physician (Obstetrics and Gynecology) Luis Purchase, MD as Consulting Physician (Gastroenterology) Gladis Cough, MD as Consulting Physician (General Surgery) Octavia Charleston, MD as Consulting Physician (Ophthalmology) Arvell Evalene SAUNDERS, DO as Consulting Physician (Sports Medicine)  Indicate any recent Medical Services you may have received from other than [facility] providers in the past year (date may be approximate).     Assessment:   This is a routine wellness examination for [facility]  Hearing/Vision screen Hearing Screening - Comments:: No hearing issues. Vision Screening - Comments:: Eye exam last year August 2024 pt has upcoming appointment.Pt has cataract growing.( [facility])   Goals Addressed             This Visit's Progress    Weight (lb) < 200 lb (90.7 kg)   158 lb 12.8 oz (72 kg)    Maintain weight loss       Depression Screen    09/03/2023    2:24 PM 08/27/2023   10:04 AM 02/27/2023   10:31 AM 04/24/2022    2:01 PM 02/20/2020    8:53 AM 02/03/2019    9:14 AM 02/01/2018    9:16 AM  PHQ 2/9 Scores  PHQ - 2 Score 0 0 0 0 0 0 0    Fall Risk    09/03/2023    2:24 PM 09/03/2023   11:42 AM 08/27/2023   10:04 AM 02/27/2023   10:30 AM 04/24/2022    2:18 PM  Fall Risk   Falls in the past year? 0 0 0 0 0  Number falls in past yr: 0 0 0 0 0  Injury with Fall? 0 0 0 0 0  Risk for fall due to : No Fall Risks  No Fall  Risks No Fall Risks No Fall Risks  Follow up Falls evaluation completed  Falls evaluation completed Falls evaluation completed Education provided    MEDICARE RISK AT HOME: Medicare Risk at Home Any stairs in or around the home?: Yes If so, are there any without handrails?: Yes Home free of loose throw rugs in walkways, pet beds, electrical cords, etc?: Yes Adequate lighting in your home to reduce risk of falls?: Yes Life alert?: No Use of a cane, walker or w/c?: No Grab bars in the bathroom?: No Shower chair or bench in shower?: No Elevated toilet seat or a handicapped toilet?: No  TIMED UP AND GO:  Was the test performed?  Yes  Length of time to ambulate 10 feet: 4 sec Gait steady and fast without use of assistive device    Cognitive Function:    04/24/2022    2:02 PM  MMSE - Mini Mental State Exam  Orientation to time 5  Orientation to Place 5  Registration 3  Attention/ Calculation 5  Recall 3  Language- name 2 objects 2  Language- repeat 1  Language- follow 3 step command 3  Language- read & follow direction 1  Write a sentence 1  Copy design 1  Total score 30        09/03/2023    2:24 PM  6CIT Screen  What Year? 0 points  What month? 0 points  What time? 0 points  Count back from 20 0  points  Months in reverse 0 points  Repeat phrase 0 points  Total Score 0 points    Immunizations Immunization History  Administered Date(s) Administered   Influenza Inj Mdck Quad Pf 10/28/2017   Influenza, High Dose Seasonal PF 11/19/2021   Influenza, Quadrivalent, Recombinant, Inj, Pf 10/28/2017   Influenza,inj,Quad PF,6+ Mos 11/30/2012, 01/03/2015, 10/24/2015, 12/16/2016, 10/22/2018, 09/29/2019   Influenza-Unspecified 11/19/2006, 10/11/2007, 11/25/2011, 10/17/2013, 10/12/2020   PFIZER(Purple Top)SARS-COV-2 Vaccination 01/30/2019, 02/17/2019, 11/04/2019, 10/03/2020   PNEUMOCOCCAL CONJUGATE-20 04/24/2022   Pfizer Covid-19 Vaccine Bivalent Booster 11yrs & up 09/22/2022    Respiratory Syncytial Virus Vaccine,Recomb Aduvanted(Arexvy) 11/06/2022   Td 11/22/2021   Tdap 11/25/2011, 11/22/2021   Zoster Recombinant(Shingrix) 03/22/2021, 09/06/2021    TDAP status: Up to date  Flu Vaccine status: Due, Education has been provided regarding the importance of this vaccine. Advised may receive this vaccine at local pharmacy or Health Dept. Aware to provide a copy of the vaccination record if obtained from local pharmacy or Health Dept. Verbalized acceptance and understanding.  Pneumococcal vaccine status: Up to date  Covid-19 vaccine status: Information provided on how to obtain vaccines.   Qualifies for Shingles Vaccine? Yes   Zostavax completed No   Shingrix Completed?: Yes  Screening Tests Health Maintenance  Topic Date Due   INFLUENZA VACCINE  10/27/2023 (Originally 08/14/2023)   COVID-19 Vaccine (6 - 2024-25 season) 10/27/2023 (Originally 11/17/2022)   DEXA SCAN  11/27/2023 (Originally 06/12/2018)   Medicare Annual Wellness (AWV)  09/02/2024   MAMMOGRAM  06/24/2025   Colonoscopy  10/30/2031   DTaP/Tdap/Td (4 - Td or Tdap) 11/23/2031   Pneumococcal Vaccine: 50+ Years  Completed   Hepatitis C Screening  Completed   Zoster Vaccines- Shingrix  Completed   HPV VACCINES  Aged Out   Meningococcal B Vaccine  Aged Out    Health Maintenance  There are no preventive care reminders to display for this patient.   Colorectal cancer screening: Type of screening: Colonoscopy. Completed 10/29/2021. Repeat every 10 years  Mammogram status: Completed 06/25/2023. Repeat every year  Bone Density status: Completed 06/11/2016. Results reflect: Bone density results: NORMAL. Repeat every 5 years.  Lung Cancer Screening: (Low Dose CT Chest recommended if Age 51-80 years, 20 pack-year currently smoking OR have quit w/in 15years.) does not qualify.   Lung Cancer Screening Referral: N/A   Additional Screening:  Hepatitis C Screening: does qualify; Completed yes  Vision  Screening: Recommended annual ophthalmology exams for early detection of glaucoma and other disorders of the eye. Is the patient up to date with their annual eye exam?  Yes  Who is the provider or what is the name of the office in which the patient attends annual eye exams? Dr.[facility]  If pt is not established with a provider, would they like to be referred to a provider to establish care? No .   Dental Screening: Recommended annual dental exams for proper oral hygiene  Diabetic Foot Exam: Diabetic Foot Exam: Completed N/A   Community Resource Referral / Chronic Care Management: CRR required this visit?  No   CCM required this visit?  No     Plan:     I have personally reviewed and noted the following in the patient's chart:   Medical and social history Use of alcohol, tobacco or illicit drugs  Current medications and supplements including opioid prescriptions. Patient is not currently taking opioid prescriptions. Functional ability and status Nutritional status Physical activity Advanced directives List of other physicians Hospitalizations, surgeries, and ER visits in previous  12 months Vitals Screenings to include cognitive, depression, and falls Referrals and appointments  In addition, I have reviewed and discussed with patient certain preventive protocols, quality metrics, and best practice recommendations. A written personalized care plan for preventive services as well as general preventive health recommendations were provided to patient.     Roxan JAYSON Plough, NP   09/03/2023   After Visit Summary: (In Person-Printed) AVS printed and given to the patient  Nurse Notes: she will get influenza and COVID-19 vaccine at the Pharmacy

## 2023-09-08 DIAGNOSIS — Z6825 Body mass index (BMI) 25.0-25.9, adult: Secondary | ICD-10-CM | POA: Diagnosis not present

## 2023-09-09 ENCOUNTER — Encounter: Payer: Self-pay | Admitting: Podiatry

## 2023-09-09 ENCOUNTER — Ambulatory Visit (INDEPENDENT_AMBULATORY_CARE_PROVIDER_SITE_OTHER): Admitting: Podiatry

## 2023-09-09 DIAGNOSIS — B351 Tinea unguium: Secondary | ICD-10-CM | POA: Diagnosis not present

## 2023-09-09 NOTE — Addendum Note (Signed)
 Addended by: WAYLAN ELIDIA PARAS on: 09/09/2023 11:56 AM   Modules accepted: Orders

## 2023-09-09 NOTE — Progress Notes (Signed)
  Subjective:  Patient ID: Judith Oconnor, female    DOB: May 15, 1955,   MRN: [id number]  Chief Complaint  Patient presents with   Nail Problem    I have had this fungus on my big toe for a while.    68 y.o. female presents for concern of fungus on her great toe that as been present for years. She has tried some OTC treatments with no success. Normally just paints nails  . Denies any other pedal complaints. Denies n/v/f/c.   Past Medical History:  Diagnosis Date   Anxiety state, unspecified    pt unsure and denies this   Contusion of buttock    DVT (deep vein thrombosis) in pregnancy    1990- none since then   Hematoma 10/06/2010   buttock    Hyperlipidemia    Stroke (HCC) 09/14/2019   possible TIA   Symptomatic menopausal or female climacteric states    Thrombophlebitis leg 1990   Varicose vein of leg    Varicose veins     Objective:  Physical Exam: Vascular: DP/PT pulses 2/4 bilateral. CFT <3 seconds. Normal hair growth on digits. No edema.  Skin. No lacerations or abrasions bilateral feet. Left hallux nail thickened and dystrophic.  Musculoskeletal: MMT 5/5 bilateral lower extremities in DF, PF, Inversion and Eversion. Deceased ROM in DF of ankle joint.  Neurological: Sensation intact to light touch.   Assessment:   1. Onychomycosis      Plan:  Patient was evaluated and treated and all questions answered. -Examined patient -Discussed treatment options for painful dystrophic nails  -Clinical picture and Fungal culture was obtained by removing a portion of the hard nail itself from each of the involved toenails using a sterile nail nipper and sent to [facility] lab. Patient tolerated the biopsy procedure well without discomfort or need for anesthesia.  -Discussed fungal nail treatment options including oral, topical, and laser treatments.  -Patient to return in 4 weeks for follow up evaluation and discussion of fungal culture results or sooner if symptoms  worsen.   Asberry Failing, DPM

## 2023-09-16 ENCOUNTER — Other Ambulatory Visit: Payer: Self-pay | Admitting: Podiatry

## 2023-09-18 DIAGNOSIS — Z124 Encounter for screening for malignant neoplasm of cervix: Secondary | ICD-10-CM | POA: Diagnosis not present

## 2023-09-18 DIAGNOSIS — Z01419 Encounter for gynecological examination (general) (routine) without abnormal findings: Secondary | ICD-10-CM | POA: Diagnosis not present

## 2023-09-18 DIAGNOSIS — Z6825 Body mass index (BMI) 25.0-25.9, adult: Secondary | ICD-10-CM | POA: Diagnosis not present

## 2023-10-13 DIAGNOSIS — H04123 Dry eye syndrome of bilateral lacrimal glands: Secondary | ICD-10-CM | POA: Diagnosis not present

## 2023-10-13 DIAGNOSIS — H25812 Combined forms of age-related cataract, left eye: Secondary | ICD-10-CM | POA: Diagnosis not present

## 2023-10-13 DIAGNOSIS — H10413 Chronic giant papillary conjunctivitis, bilateral: Secondary | ICD-10-CM | POA: Diagnosis not present

## 2023-10-13 DIAGNOSIS — H43812 Vitreous degeneration, left eye: Secondary | ICD-10-CM | POA: Diagnosis not present

## 2023-10-14 ENCOUNTER — Encounter: Payer: Self-pay | Admitting: Podiatry

## 2023-10-14 ENCOUNTER — Ambulatory Visit: Admitting: Podiatry

## 2023-10-14 DIAGNOSIS — B351 Tinea unguium: Secondary | ICD-10-CM | POA: Diagnosis not present

## 2023-10-14 MED ORDER — TERBINAFINE HCL 250 MG PO TABS: 250.0000 mg | ORAL_TABLET | Freq: Every day | ORAL | 0 refills | Status: DC

## 2023-10-14 NOTE — Progress Notes (Signed)
  Subjective:  Patient ID: Judith Oconnor, female    DOB: 13-Apr-1955,   MRN: [id number]  No chief complaint on file.   68 y.o. female presents for follow-up of fungal nails and to review culture results. . Denies any other pedal complaints. Denies n/v/f/c.   Past Medical History:  Diagnosis Date   Anxiety state, unspecified    pt unsure and denies this   Contusion of buttock    DVT (deep vein thrombosis) in pregnancy    1990- none since then   Hematoma 10/06/2010   buttock    Hyperlipidemia    Stroke (HCC) 09/14/2019   possible TIA   Symptomatic menopausal or female climacteric states    Thrombophlebitis leg 1990   Varicose vein of leg    Varicose veins     Objective:  Physical Exam: Vascular: DP/PT pulses 2/4 bilateral. CFT <3 seconds. Normal hair growth on digits. No edema.  Skin. No lacerations or abrasions bilateral feet. Left hallux nail thickened and dystrophic.  Musculoskeletal: MMT 5/5 bilateral lower extremities in DF, PF, Inversion and Eversion. Deceased ROM in DF of ankle joint.  Neurological: Sensation intact to light touch.   Assessment:   1. Onychomycosis      Plan:  Patient was evaluated and treated and all questions answered. -Examined patient -Discussed treatment options for painful dystrophic nails  -Culture positive for saprophytic fungus.  -Discussed fungal nail treatment options including oral, topical, and laser treatments.  -Patient would like to try lamisil 90 days sent to pharmacy.  -Previous LFTS wnl.  -Patient to return in 3 months for rehceck.    Asberry Failing, DPM

## 2023-11-06 DIAGNOSIS — H25812 Combined forms of age-related cataract, left eye: Secondary | ICD-10-CM | POA: Diagnosis not present

## 2023-11-09 DIAGNOSIS — Z6825 Body mass index (BMI) 25.0-25.9, adult: Secondary | ICD-10-CM | POA: Diagnosis not present

## 2023-11-24 DIAGNOSIS — H2511 Age-related nuclear cataract, right eye: Secondary | ICD-10-CM | POA: Diagnosis not present

## 2023-11-27 DIAGNOSIS — H5371 Glare sensitivity: Secondary | ICD-10-CM | POA: Diagnosis not present

## 2023-11-27 DIAGNOSIS — H25811 Combined forms of age-related cataract, right eye: Secondary | ICD-10-CM | POA: Diagnosis not present

## 2023-12-14 ENCOUNTER — Encounter: Payer: Self-pay | Admitting: Podiatry

## 2023-12-30 ENCOUNTER — Ambulatory Visit: Admitting: Family

## 2023-12-30 VITALS — BP 128/78 | HR 66 | Temp 97.3°F | Ht 67.0 in | Wt 160.4 lb

## 2023-12-30 DIAGNOSIS — J0101 Acute recurrent maxillary sinusitis: Secondary | ICD-10-CM

## 2023-12-30 MED ORDER — AZITHROMYCIN 250 MG PO TABS: ORAL_TABLET | ORAL | 0 refills | Status: AC

## 2023-12-30 NOTE — Progress Notes (Signed)
 "  Provider: Matika Bartell FNP-C  Hien Perreira, Roxan BROCKS, NP  Patient Care Team: Aolani Piggott, Roxan BROCKS, NP as PCP - General (Family Medicine) Gerlean Lynwood BIRCH, MD (Inactive) as Consulting Physician (Vascular Surgery) Rosalynn LELON Ingle, MD (Inactive) as Consulting Physician (Obstetrics and Gynecology) Luis Purchase, MD as Consulting Physician (Gastroenterology) Gladis Cough, MD as Consulting Physician (General Surgery) Octavia Charleston, MD as Consulting Physician (Ophthalmology) Arvell Evalene SAUNDERS, DO as Consulting Physician (Sports Medicine)  Extended Emergency Contact Information Primary Emergency Contact: Elliott,Ken Address: [address]          [place], [place] [address] [place] Mobile Phone: [phone] Relation: Significant other Secondary Emergency Contact: Mankin,Julia Address: [address]          [place], [place] [address] [place] Phone: [phone] Relation: Daughter Father: Craven,Ben          [place], [place] Home Phone: [phone]  Code Status:  Full Code  Goals of care: Advanced Directive information    09/03/2023    2:22 PM  Advanced Directives  Does Patient Have a Medical Advance Directive? No  Would patient like information on creating a medical advance directive? No - Patient declined     Chief Complaint  Patient presents with   Sinus congestion    Ongoing for about a week. To her knowledge no exposure to covid or flu. Significant other was sick but did not test positive for covid or flu.    HPI:  Pt is a 68 y.o. female seen today for an acute visit for evaluation of sinus congestion x 1 week. Symptoms associated with sore throat, postnasal drip, headache and drainage drainage.She denies any exposure to sick person with influenza and COVID.  Has not tested for COVID or flu.She denies any fever,chills,cough,fatigue,body aches,chest tightness,chest pain,palpitation or shortness of breath.  Past  Medical History:  Diagnosis Date   Anxiety state, unspecified    pt unsure and denies this   Contusion of buttock    DVT (deep vein thrombosis) in pregnancy    1990- none since then   Hematoma 10/06/2010   buttock    Hyperlipidemia    Stroke (HCC) 09/14/2019   possible TIA   Symptomatic menopausal or female climacteric states    Thrombophlebitis leg 1990   Varicose vein of leg    Varicose veins    Past Surgical History:  Procedure Laterality Date   CATARACT EXTRACTION, BILATERAL Bilateral    CHOLECYSTECTOMY  12/13/1988   Dr.Matt Gladis   COLONOSCOPY  09/14/2008   Diverticulosis, Dr.Medoff    ENDOMETRIAL ABLATION  01/14/2004   Dr.Ron Rosalynn    VARICOSE VEIN SURGERY Left 10/12/2013   [facility]    Allergies[1]  Outpatient Encounter Medications as of 12/30/2023  Medication Sig   aspirin  EC 81 MG tablet Take 81 mg by mouth daily. Swallow whole.   Cholecalciferol (VITAMIN D3) 10 MCG (400 UNIT) CAPS Take by mouth.   rosuvastatin  (CRESTOR ) 5 MG tablet TAKE 1 TABLET BY MOUTH EVERY NIGHT AT BEDTIME   UNABLE TO FIND Inject 2.5 mg into the skin once a week. Lipo slim injection   terbinafine  (LAMISIL ) 250 MG tablet Take 1 tablet (250 mg total) by mouth daily. (Patient not taking: Reported on 12/30/2023)   Facility-Administered Encounter Medications as of 12/30/2023  Medication   sodium chloride  flush (NS) 0.9 % injection 10 mL    Review of Systems  Constitutional:  Negative for appetite change, chills, fatigue,  fever and unexpected weight change.  HENT:  Positive for congestion, postnasal drip, sinus pain and sore throat. Negative for ear discharge, ear pain, hearing loss, nosebleeds, rhinorrhea, sinus pressure, sneezing and tinnitus.   Eyes:  Negative for pain, discharge, redness, itching and visual disturbance.  Respiratory:  Negative for cough, chest tightness, shortness of breath and wheezing.   Cardiovascular:  Negative for chest pain, palpitations and leg  swelling.  Gastrointestinal:  Negative for abdominal distention, abdominal pain, diarrhea, nausea and vomiting.  Musculoskeletal:  Negative for arthralgias, back pain, gait problem, joint swelling and myalgias.  Skin:  Negative for color change, pallor and rash.  Neurological:  Negative for dizziness, weakness, light-headedness, numbness and headaches.    Immunization History  Administered Date(s) Administered   INFLUENZA, HIGH DOSE SEASONAL PF 11/19/2021, 10/16/2023   Influenza Inj Mdck Quad Pf 10/28/2017   Influenza, Quadrivalent, Recombinant, Inj, Pf 10/28/2017   Influenza,inj,Quad PF,6+ Mos 11/30/2012, 01/03/2015, 10/24/2015, 12/16/2016, 10/22/2018, 09/29/2019   Influenza-Unspecified 11/19/2006, 10/11/2007, 11/25/2011, 10/17/2013, 10/12/2020   PFIZER(Purple Top)SARS-COV-2 Vaccination 01/30/2019, 02/17/2019, 11/04/2019, 10/03/2020   PNEUMOCOCCAL CONJUGATE-20 04/24/2022   Pfizer Covid-19 Vaccine Bivalent Booster 71yrs & up 09/22/2022   Respiratory Syncytial Virus Vaccine,Recomb Aduvanted(Arexvy) 11/06/2022   Td 11/22/2021   Tdap 11/25/2011, 11/22/2021   Unspecified SARS-COV-2 Vaccination 10/16/2023   Zoster Recombinant(Shingrix) 03/22/2021, 09/06/2021   Pertinent  Health Maintenance Due  Topic Date Due   Bone Density Scan  06/12/2018   Mammogram  06/24/2025   Colonoscopy  10/30/2031   Influenza Vaccine  Completed      04/24/2022    2:18 PM 02/27/2023   10:30 AM 08/27/2023   10:04 AM 09/03/2023   11:42 AM 09/03/2023    2:24 PM  Fall Risk  Falls in the past year? 0 0 0 0  0  Was there an injury with Fall? 0  0  0  0   0   Fall Risk Category Calculator 0 0 0 0  0  Patient at Risk for Falls Due to No Fall Risks No Fall Risks No Fall Risks  No Fall Risks  Fall risk Follow up Education provided Falls evaluation completed Falls evaluation completed  Falls evaluation completed     Manually entered by patient   Data saved with a previous flowsheet row definition   Functional Status  Survey:    Vitals:   12/30/23 1152  BP: 128/78  Pulse: 66  Temp: (!) 97.3 F (36.3 C)  TempSrc: Temporal  SpO2: 98%  Weight: 160 lb 6.4 oz (72.8 kg)  Height: 5' 7 (1.702 m)   Body mass index is 25.12 kg/m. Physical Exam Vitals reviewed.  Constitutional:      General: She is not in acute distress.    Appearance: Normal appearance. She is overweight. She is not ill-appearing or diaphoretic.  HENT:     Head: Normocephalic.     Right Ear: Tympanic membrane, ear canal and external ear normal. There is no impacted cerumen.     Left Ear: Tympanic membrane, ear canal and external ear normal. There is no impacted cerumen.     Nose: Nose normal. No congestion or rhinorrhea.     Mouth/Throat:     Mouth: Mucous membranes are moist.     Pharynx: Oropharynx is clear. No oropharyngeal exudate or posterior oropharyngeal erythema.  Eyes:     General: No scleral icterus.       Right eye: No discharge.        Left eye: No discharge.  Extraocular Movements: Extraocular movements intact.     Conjunctiva/sclera: Conjunctivae normal.     Pupils: Pupils are equal, round, and reactive to light.  Neck:     Vascular: No carotid bruit.  Cardiovascular:     Rate and Rhythm: Normal rate and regular rhythm.     Pulses: Normal pulses.     Heart sounds: Normal heart sounds. No murmur heard.    No friction rub. No gallop.  Pulmonary:     Effort: Pulmonary effort is normal. No respiratory distress.     Breath sounds: Normal breath sounds. No wheezing, rhonchi or rales.  Chest:     Chest wall: No tenderness.  Abdominal:     General: Bowel sounds are normal. There is no distension.     Palpations: Abdomen is soft. There is no mass.     Tenderness: There is no abdominal tenderness. There is no right CVA tenderness, left CVA tenderness, guarding or rebound.  Musculoskeletal:        General: No swelling or tenderness. Normal range of motion.     Cervical back: Normal range of motion. No rigidity or  tenderness.     Right lower leg: No edema.     Left lower leg: No edema.  Lymphadenopathy:     Cervical: No cervical adenopathy.  Skin:    General: Skin is warm and dry.     Coloration: Skin is not pale.     Findings: No bruising, erythema, lesion or rash.  Neurological:     Mental Status: She is alert and oriented to person, place, and time.     Cranial Nerves: No cranial nerve deficit.     Sensory: No sensory deficit.     Motor: No weakness.     Coordination: Coordination normal.     Gait: Gait normal.  Psychiatric:        Mood and Affect: Mood normal.        Speech: Speech normal.        Behavior: Behavior normal.        Thought Content: Thought content normal.        Judgment: Judgment normal.    Labs reviewed: Recent Labs    02/25/23 0832 08/24/23 0825  NA 141 141  K 4.5 4.4  CL 104 104  CO2 29 29  GLUCOSE 104* 94  BUN 15 18  CREATININE 0.77 0.71  CALCIUM  10.0 9.6   Recent Labs    02/25/23 0832 08/24/23 0825  AST 16 15  ALT 20 23  BILITOT 0.5 0.5  PROT 7.0 6.8   Recent Labs    02/25/23 0832 08/24/23 0804  WBC 6.2 7.5  NEUTROABS 3,497 4,485  HGB 13.4 13.8  HCT 41.3 41.9  MCV 89.6 90.9  PLT 268 254   Lab Results  Component Value Date   TSH 2.10 08/24/2023   Lab Results  Component Value Date   HGBA1C 5.4 08/24/2023   Lab Results  Component Value Date   CHOL 130 08/24/2023   HDL 55 08/24/2023   LDLCALC 51 08/24/2023   TRIG 159 (H) 08/24/2023   CHOLHDL 2.4 08/24/2023    Significant Diagnostic Results in last 30 days:  No results found.  Assessment/Plan Acute recurrent maxillary sinusitis (Primary) Bilateral sinus maxillary tenderness on palpation. -Recommend over-the-counter loratadine 10 mg tablets daily for 2 days minimize -Start on azithromycin  as below.  Side effects discussed - azithromycin  (ZITHROMAX ) 250 MG tablet; Take 2 tablets on day 1, then 1 tablet daily on  days 2 through 5  Dispense: 6 tablet; Refill: 0 - Notify provider  or go to urgent care if symptoms worsen or not resolved  Family/ staff Communication: Reviewed plan of care with patient verbalized understanding  Labs/tests ordered: None   Next Appointment: Return if symptoms worsen or fail to improve.  Total time: 20 minutes. Greater than 50% of total time spent doing patient education regarding maxillary sinusitis and health maintenance including symptom/medication management.   Roxan BROCKS Luisantonio Adinolfi, NP     [1] No Known Allergies  "

## 2024-01-18 ENCOUNTER — Ambulatory Visit: Admitting: Podiatry

## 2024-01-19 ENCOUNTER — Other Ambulatory Visit: Payer: Self-pay | Admitting: Family

## 2024-01-19 DIAGNOSIS — E785 Hyperlipidemia, unspecified: Secondary | ICD-10-CM

## 2024-01-19 DIAGNOSIS — G459 Transient cerebral ischemic attack, unspecified: Secondary | ICD-10-CM

## 2024-01-20 ENCOUNTER — Encounter: Payer: Self-pay | Admitting: Podiatry

## 2024-01-20 ENCOUNTER — Ambulatory Visit: Admitting: Podiatry

## 2024-01-20 DIAGNOSIS — B351 Tinea unguium: Secondary | ICD-10-CM

## 2024-01-20 MED ORDER — CICLOPIROX 8 % EX SOLN: Freq: Every day | CUTANEOUS | 0 refills | Status: AC

## 2024-01-20 NOTE — Progress Notes (Signed)
"  °  Subjective:  Patient ID: Judith Oconnor, female    DOB: 1955/09/21,   MRN: [id number]  Chief Complaint  Patient presents with   Nail Problem    I stopped taking the Terbinafine .  I broke out in a rash.    69 y.o. female presents for follow-up of fungal nails. She had to stop taking the Lamisil  as she broke out in a rash. Not much change with the nails.  Denies any other pedal complaints. Denies n/v/f/c.   Past Medical History:  Diagnosis Date   Anxiety state, unspecified    pt unsure and denies this   Contusion of buttock    DVT (deep vein thrombosis) in pregnancy    1990- none since then   Hematoma 10/06/2010   buttock    Hyperlipidemia    Stroke (HCC) 09/14/2019   possible TIA   Symptomatic menopausal or female climacteric states    Thrombophlebitis leg 1990   Varicose vein of leg    Varicose veins     Objective:  Physical Exam: Vascular: DP/PT pulses 2/4 bilateral. CFT <3 seconds. Normal hair growth on digits. No edema.  Skin. No lacerations or abrasions bilateral feet. Left hallux nail thickened and dystrophic.  Musculoskeletal: MMT 5/5 bilateral lower extremities in DF, PF, Inversion and Eversion. Deceased ROM in DF of ankle joint.  Neurological: Sensation intact to light touch.   Assessment:   1. Onychomycosis       Plan:  Patient was evaluated and treated and all questions answered. -Examined patient -Discussed treatment options for painful dystrophic nails  -Culture positive for saprophytic fungus.  -Discussed fungal nail treatment options including oral, topical, and laser treatments.  -Will discontinue lamisil .  -Will try penlac . Sent to pharmacy  -Patient to return in 3 months for rehceck.    Asberry Failing, DPM    "

## 2024-01-22 ENCOUNTER — Telehealth: Payer: Self-pay

## 2024-01-22 NOTE — Telephone Encounter (Signed)
 PA request received fro [employer]  8% solution. PA submitted through covermymeds and waiting on response.  Judith Oconnor  (Key: [id number]) PA Case ID #: [id number] Rx #: [phone]

## 2024-01-25 NOTE — Telephone Encounter (Signed)
 PA was approved. 01/22/24-12/23/24

## 2024-02-29 ENCOUNTER — Other Ambulatory Visit: Payer: Self-pay

## 2024-03-03 ENCOUNTER — Ambulatory Visit: Payer: Self-pay | Admitting: Family

## 2024-07-20 ENCOUNTER — Ambulatory Visit: Admitting: Podiatry

## 2024-09-05 ENCOUNTER — Encounter: Payer: Self-pay | Admitting: Family
